# Patient Record
Sex: Female | Born: 1970 | Race: White | Hispanic: No | Marital: Married | Smoking: Never smoker
Health system: Southern US, Community
[De-identification: ages and names within clinical notes are randomized; demographics above are authoritative.]

## PROBLEM LIST (undated history)

## (undated) DIAGNOSIS — F32A Depression, unspecified: Secondary | ICD-10-CM

## (undated) DIAGNOSIS — T7840XA Allergy, unspecified, initial encounter: Secondary | ICD-10-CM

## (undated) DIAGNOSIS — Z8619 Personal history of other infectious and parasitic diseases: Secondary | ICD-10-CM

## (undated) DIAGNOSIS — F329 Major depressive disorder, single episode, unspecified: Secondary | ICD-10-CM

## (undated) HISTORY — DX: Allergy, unspecified, initial encounter: T78.40XA

## (undated) HISTORY — DX: Major depressive disorder, single episode, unspecified: F32.9

## (undated) HISTORY — DX: Personal history of other infectious and parasitic diseases: Z86.19

## (undated) HISTORY — DX: Depression, unspecified: F32.A

---

## 1998-09-28 ENCOUNTER — Other Ambulatory Visit: Admission: RE | Admit: 1998-09-28 | Discharge: 1998-09-28 | Payer: Self-pay | Admitting: Obstetrics and Gynecology

## 1999-05-08 ENCOUNTER — Other Ambulatory Visit: Admission: RE | Admit: 1999-05-08 | Discharge: 1999-05-08 | Payer: Self-pay | Admitting: Obstetrics and Gynecology

## 1999-05-23 ENCOUNTER — Inpatient Hospital Stay (HOSPITAL_COMMUNITY): Admission: AD | Admit: 1999-05-23 | Discharge: 1999-05-23 | Payer: Self-pay | Admitting: Obstetrics & Gynecology

## 1999-09-14 ENCOUNTER — Inpatient Hospital Stay (HOSPITAL_COMMUNITY): Admission: AD | Admit: 1999-09-14 | Discharge: 1999-09-14 | Payer: Self-pay | Admitting: Obstetrics and Gynecology

## 1999-09-25 ENCOUNTER — Inpatient Hospital Stay (HOSPITAL_COMMUNITY): Admission: AD | Admit: 1999-09-25 | Discharge: 1999-09-25 | Payer: Self-pay | Admitting: Obstetrics and Gynecology

## 1999-10-19 ENCOUNTER — Inpatient Hospital Stay (HOSPITAL_COMMUNITY): Admission: AD | Admit: 1999-10-19 | Discharge: 1999-10-19 | Payer: Self-pay | Admitting: Obstetrics & Gynecology

## 1999-10-19 ENCOUNTER — Encounter: Payer: Self-pay | Admitting: Obstetrics & Gynecology

## 1999-12-01 ENCOUNTER — Inpatient Hospital Stay (HOSPITAL_COMMUNITY): Admission: AD | Admit: 1999-12-01 | Discharge: 1999-12-06 | Payer: Self-pay | Admitting: Obstetrics and Gynecology

## 1999-12-01 ENCOUNTER — Encounter (INDEPENDENT_AMBULATORY_CARE_PROVIDER_SITE_OTHER): Payer: Self-pay | Admitting: Specialist

## 2000-02-05 ENCOUNTER — Other Ambulatory Visit: Admission: RE | Admit: 2000-02-05 | Discharge: 2000-02-05 | Payer: Self-pay | Admitting: Obstetrics and Gynecology

## 2001-05-26 ENCOUNTER — Other Ambulatory Visit: Admission: RE | Admit: 2001-05-26 | Discharge: 2001-05-26 | Payer: Self-pay | Admitting: Obstetrics and Gynecology

## 2005-03-14 ENCOUNTER — Ambulatory Visit: Payer: Self-pay | Admitting: Internal Medicine

## 2005-04-04 ENCOUNTER — Ambulatory Visit: Payer: Self-pay | Admitting: Internal Medicine

## 2005-04-10 ENCOUNTER — Ambulatory Visit: Payer: Self-pay | Admitting: Internal Medicine

## 2005-08-18 ENCOUNTER — Ambulatory Visit: Payer: Self-pay | Admitting: Internal Medicine

## 2005-10-27 ENCOUNTER — Ambulatory Visit: Payer: Self-pay | Admitting: Internal Medicine

## 2007-03-04 ENCOUNTER — Ambulatory Visit: Payer: Self-pay | Admitting: Internal Medicine

## 2007-03-04 LAB — CONVERTED CEMR LAB
ALT: 12 units/L (ref 0–40)
AST: 24 units/L (ref 0–37)
Albumin: 3.6 g/dL (ref 3.5–5.2)
Alkaline Phosphatase: 58 units/L (ref 39–117)
BUN: 12 mg/dL (ref 6–23)
Basophils Absolute: 0 10*3/uL (ref 0.0–0.1)
Basophils Relative: 0.4 % (ref 0.0–1.0)
Bilirubin, Direct: 0.1 mg/dL (ref 0.0–0.3)
CO2: 28 meq/L (ref 19–32)
Calcium: 9.5 mg/dL (ref 8.4–10.5)
Chloride: 104 meq/L (ref 96–112)
Cholesterol: 196 mg/dL (ref 0–200)
Creatinine, Ser: 1 mg/dL (ref 0.4–1.2)
Eosinophils Absolute: 0.2 10*3/uL (ref 0.0–0.6)
Eosinophils Relative: 2.4 % (ref 0.0–5.0)
GFR calc Af Amer: 82 mL/min
GFR calc non Af Amer: 67 mL/min
Glucose, Bld: 86 mg/dL (ref 70–99)
HCT: 41.4 % (ref 36.0–46.0)
HDL: 51.6 mg/dL (ref >39.0)
Hemoglobin: 14.6 g/dL (ref 12.0–15.0)
LDL Cholesterol: 112 mg/dL — ABNORMAL HIGH (ref 0–99)
Lymphocytes Relative: 37.6 % (ref 12.0–46.0)
MCHC: 35.2 g/dL (ref 30.0–36.0)
MCV: 90.3 fL (ref 78.0–100.0)
Monocytes Absolute: 0.8 10*3/uL — ABNORMAL HIGH (ref 0.2–0.7)
Monocytes Relative: 11.4 % — ABNORMAL HIGH (ref 3.0–11.0)
Neutro Abs: 3.4 10*3/uL (ref 1.4–7.7)
Neutrophils Relative %: 48.2 % (ref 43.0–77.0)
Platelets: 311 10*3/uL (ref 150–400)
Potassium: 4.1 meq/L (ref 3.5–5.1)
RBC: 4.59 M/uL (ref 3.87–5.11)
RDW: 12.3 % (ref 11.5–14.6)
Sodium: 139 meq/L (ref 135–145)
TSH: 0.48 microintl units/mL (ref 0.35–5.50)
Total Bilirubin: 0.9 mg/dL (ref 0.3–1.2)
Total CHOL/HDL Ratio: 3.8
Total Protein: 6.9 g/dL (ref 6.0–8.3)
Triglycerides: 164 mg/dL — ABNORMAL HIGH (ref 0–149)
VLDL: 33 mg/dL (ref 0–40)
WBC: 7.1 10*3/uL (ref 4.5–10.5)

## 2007-03-11 ENCOUNTER — Ambulatory Visit: Payer: Self-pay | Admitting: Internal Medicine

## 2007-03-15 ENCOUNTER — Ambulatory Visit: Payer: Self-pay | Admitting: Internal Medicine

## 2007-04-29 ENCOUNTER — Ambulatory Visit: Payer: Self-pay | Admitting: Internal Medicine

## 2007-04-29 DIAGNOSIS — J020 Streptococcal pharyngitis: Secondary | ICD-10-CM

## 2007-04-29 DIAGNOSIS — J309 Allergic rhinitis, unspecified: Secondary | ICD-10-CM | POA: Insufficient documentation

## 2007-04-29 DIAGNOSIS — F988 Other specified behavioral and emotional disorders with onset usually occurring in childhood and adolescence: Secondary | ICD-10-CM

## 2007-04-29 DIAGNOSIS — F32A Depression, unspecified: Secondary | ICD-10-CM | POA: Insufficient documentation

## 2007-04-29 DIAGNOSIS — F329 Major depressive disorder, single episode, unspecified: Secondary | ICD-10-CM

## 2007-04-29 LAB — CONVERTED CEMR LAB
Bilirubin Urine: NEGATIVE
Nitrite: NEGATIVE
Rapid Strep: NEGATIVE
Specific Gravity, Urine: >=1.03
Urobilinogen, UA: 0.2
WBC Urine, dipstick: NEGATIVE
pH: 5.5

## 2007-07-30 ENCOUNTER — Telehealth: Payer: Self-pay | Admitting: Internal Medicine

## 2007-08-16 ENCOUNTER — Telehealth: Payer: Self-pay | Admitting: Internal Medicine

## 2007-08-24 ENCOUNTER — Telehealth: Payer: Self-pay | Admitting: Internal Medicine

## 2007-10-08 ENCOUNTER — Telehealth: Payer: Self-pay | Admitting: Internal Medicine

## 2008-01-17 ENCOUNTER — Telehealth: Payer: Self-pay | Admitting: Internal Medicine

## 2008-04-18 ENCOUNTER — Telehealth: Payer: Self-pay | Admitting: *Deleted

## 2008-06-12 ENCOUNTER — Telehealth: Payer: Self-pay | Admitting: Internal Medicine

## 2008-06-19 ENCOUNTER — Ambulatory Visit: Payer: Self-pay | Admitting: Internal Medicine

## 2008-06-19 DIAGNOSIS — J209 Acute bronchitis, unspecified: Secondary | ICD-10-CM | POA: Insufficient documentation

## 2008-06-19 DIAGNOSIS — R062 Wheezing: Secondary | ICD-10-CM

## 2008-06-20 ENCOUNTER — Telehealth: Payer: Self-pay | Admitting: Internal Medicine

## 2008-07-07 ENCOUNTER — Telehealth: Payer: Self-pay | Admitting: Internal Medicine

## 2008-07-10 ENCOUNTER — Telehealth: Payer: Self-pay | Admitting: Internal Medicine

## 2008-07-11 ENCOUNTER — Telehealth: Payer: Self-pay | Admitting: Internal Medicine

## 2008-07-11 DIAGNOSIS — R059 Cough, unspecified: Secondary | ICD-10-CM | POA: Insufficient documentation

## 2008-07-11 DIAGNOSIS — R05 Cough: Secondary | ICD-10-CM

## 2008-07-12 ENCOUNTER — Ambulatory Visit: Payer: Self-pay | Admitting: Internal Medicine

## 2008-08-01 ENCOUNTER — Telehealth: Payer: Self-pay | Admitting: Internal Medicine

## 2008-08-07 ENCOUNTER — Telehealth: Payer: Self-pay | Admitting: Internal Medicine

## 2008-08-08 ENCOUNTER — Ambulatory Visit: Payer: Self-pay | Admitting: Internal Medicine

## 2008-09-19 ENCOUNTER — Ambulatory Visit: Payer: Self-pay | Admitting: Family Medicine

## 2008-09-19 DIAGNOSIS — M542 Cervicalgia: Secondary | ICD-10-CM | POA: Insufficient documentation

## 2008-09-19 DIAGNOSIS — G44209 Tension-type headache, unspecified, not intractable: Secondary | ICD-10-CM

## 2008-10-02 ENCOUNTER — Ambulatory Visit: Payer: Self-pay | Admitting: Internal Medicine

## 2008-10-02 ENCOUNTER — Telehealth: Payer: Self-pay | Admitting: Internal Medicine

## 2008-10-04 ENCOUNTER — Telehealth: Payer: Self-pay | Admitting: Internal Medicine

## 2008-11-09 ENCOUNTER — Telehealth: Payer: Self-pay | Admitting: Internal Medicine

## 2008-11-15 ENCOUNTER — Telehealth: Payer: Self-pay | Admitting: Internal Medicine

## 2008-12-13 ENCOUNTER — Telehealth: Payer: Self-pay | Admitting: Internal Medicine

## 2008-12-27 ENCOUNTER — Telehealth: Payer: Self-pay | Admitting: Internal Medicine

## 2009-02-02 ENCOUNTER — Telehealth: Payer: Self-pay | Admitting: Internal Medicine

## 2009-03-13 ENCOUNTER — Telehealth: Payer: Self-pay | Admitting: Internal Medicine

## 2009-06-04 ENCOUNTER — Telehealth: Payer: Self-pay | Admitting: Internal Medicine

## 2009-06-18 ENCOUNTER — Telehealth: Payer: Self-pay | Admitting: Internal Medicine

## 2009-08-06 ENCOUNTER — Telehealth: Payer: Self-pay | Admitting: Internal Medicine

## 2009-09-19 ENCOUNTER — Telehealth: Payer: Self-pay | Admitting: Internal Medicine

## 2009-09-20 ENCOUNTER — Ambulatory Visit: Payer: Self-pay | Admitting: Internal Medicine

## 2009-09-20 DIAGNOSIS — B009 Herpesviral infection, unspecified: Secondary | ICD-10-CM | POA: Insufficient documentation

## 2009-09-20 DIAGNOSIS — G4700 Insomnia, unspecified: Secondary | ICD-10-CM

## 2009-10-22 ENCOUNTER — Telehealth: Payer: Self-pay | Admitting: Internal Medicine

## 2009-10-24 ENCOUNTER — Telehealth: Payer: Self-pay | Admitting: Internal Medicine

## 2010-01-03 ENCOUNTER — Telehealth: Payer: Self-pay | Admitting: Internal Medicine

## 2010-01-08 ENCOUNTER — Telehealth: Payer: Self-pay | Admitting: Internal Medicine

## 2010-02-05 ENCOUNTER — Ambulatory Visit: Payer: Self-pay | Admitting: Family Medicine

## 2010-02-05 DIAGNOSIS — J019 Acute sinusitis, unspecified: Secondary | ICD-10-CM

## 2010-02-21 ENCOUNTER — Ambulatory Visit: Payer: Self-pay | Admitting: Internal Medicine

## 2010-04-10 ENCOUNTER — Telehealth: Payer: Self-pay | Admitting: Internal Medicine

## 2010-05-06 ENCOUNTER — Ambulatory Visit: Payer: Self-pay | Admitting: Family Medicine

## 2010-05-06 DIAGNOSIS — E669 Obesity, unspecified: Secondary | ICD-10-CM

## 2010-05-07 ENCOUNTER — Encounter: Payer: Self-pay | Admitting: Internal Medicine

## 2010-05-13 ENCOUNTER — Telehealth: Payer: Self-pay | Admitting: Family Medicine

## 2010-05-16 ENCOUNTER — Telehealth: Payer: Self-pay | Admitting: Family Medicine

## 2010-07-15 ENCOUNTER — Telehealth: Payer: Self-pay | Admitting: Internal Medicine

## 2010-07-17 ENCOUNTER — Telehealth: Payer: Self-pay | Admitting: Internal Medicine

## 2010-07-22 ENCOUNTER — Telehealth: Payer: Self-pay | Admitting: Internal Medicine

## 2010-07-24 ENCOUNTER — Ambulatory Visit: Payer: Self-pay | Admitting: Internal Medicine

## 2010-07-24 DIAGNOSIS — D485 Neoplasm of uncertain behavior of skin: Secondary | ICD-10-CM

## 2010-07-25 ENCOUNTER — Telehealth: Payer: Self-pay | Admitting: Internal Medicine

## 2010-08-29 ENCOUNTER — Telehealth: Payer: Self-pay | Admitting: Internal Medicine

## 2010-10-22 NOTE — Progress Notes (Signed)
Summary: new rx  Phone Note Refill Request Call back at Home Phone [phone] Message from:  Patient  Refills Requested: Medication #1:  ADDERALL 10 MG  TABS one in pm adderall 20 xr #90 for both rxs  Initial call taken by: Heron Sabins,  August 29, 2010 12:14 PM    New/Updated Medications: ADDERALL XR 20 MG XR24H-CAP (AMPHETAMINE-DEXTROAMPHETAMINE) 1 once daily Prescriptions: ADDERALL XR 20 MG XR24H-CAP (AMPHETAMINE-DEXTROAMPHETAMINE) 1 once daily  #90 x 0   Entered by:   Willy Eddy, LPN   Authorized by:   Stacie Glaze MD   Signed by:   Willy Eddy, LPN on 09/81/1914   Method used:   Print then Give to Patient   RxID:   [id number]   Appended Document: new rx talked with pt and she stopped vyvanse and dr Lovell Sheehan said ok to 20 mg

## 2010-10-22 NOTE — Progress Notes (Signed)
Summary: regular ambien  Phone Note Call from Patient Call back at [place] Phone [phone]   Summary of Call: Requests the regular ambien again, the CR makes her too groggy the next day even taking 1/2.  [facility] Initial call taken by: Rudy Jew, RN,  October 24, 2009 12:38 PM    New/Updated Medications: ZOLPIDEM TARTRATE 10 MG TABS (ZOLPIDEM TARTRATE) 1 at bedtime as needed Prescriptions: ZOLPIDEM TARTRATE 10 MG TABS (ZOLPIDEM TARTRATE) 1 at bedtime as needed  #30 x 1   Entered by:   Willy Eddy, LPN   Authorized by:   Stacie Glaze MD   Signed by:   Willy Eddy, LPN on 46/96/2952   Method used:   Telephoned to ...       [facility] [phone]* (retail)       [address]       [place], [place]  [address]       Ph: [phone]       Fax: [phone]   RxID:   [id number]

## 2010-10-22 NOTE — Assessment & Plan Note (Signed)
Summary: ?SINUS INF/COUGH/CONGESTION/CJR   Vital Signs:  Patient profile:   40 year old female Temp:     98.3 degrees F oral BP sitting:   120 / 84  (left arm) Cuff size:   large  Vitals Entered By: Sid Falcon LPN (Feb 05, 2010 9:42 AM) CC: Questioning sinus infection, cough, congestion   History of Present Illness: Patient seen as a work in with one-month history of malaise and sinus pressure and intermittent headaches. Occasional yellow-green nasal discharge. She has history of some seasonal allergies but has been taking Allegra-D without improvement. Does have some intermittent facial discomfort. No fever. Request refills Nasacort AQ.  Allergies (verified): No Known Drug Allergies  Past History:  Past Medical History: Last updated: 04/29/2007 Allergic rhinitis Depression mono as an adult  Review of Systems      See HPI  Physical Exam  General:  Well-developed,well-nourished,in no acute distress; alert,appropriate and cooperative throughout examination Head:  Normocephalic and atraumatic without obvious abnormalities. No apparent alopecia or balding. Ears:  External ear exam shows no significant lesions or deformities.  Otoscopic examination reveals clear canals, tympanic membranes are intact bilaterally without bulging, retraction, inflammation or discharge. Hearing is grossly normal bilaterally. Nose:  External nasal examination shows no deformity or inflammation. Nasal mucosa are pink and moist without lesions or exudates. Mouth:  Oral mucosa and oropharynx without lesions or exudates.  Teeth in good repair. Neck:  No deformities, masses, or tenderness noted. Lungs:  Normal respiratory effort, chest expands symmetrically. Lungs are clear to auscultation, no crackles or wheezes. Heart:  Normal rate and regular rhythm. S1 and S2 normal without gallop, murmur, click, rub or other extra sounds.   Impression & Recommendations:  Problem # 1:  SINUSITIS, ACUTE  (ICD-461.9) start clarithromycin and refil Nasacort AQ. Her updated medication list for this problem includes:    Fexofenadine-pseudoephedrine 60-120 Mg Xr12h-tab (Fexofenadine-pseudoephedrine) ..... One by mouth bid    Nasacort Aq 55 Mcg/act Aers (Triamcinolone acetonide(nasal)) .Marland Kitchen..Marland Kitchen Two sprays q nostril q day    Bromfed 12-15 Mg Xr12h-cap (Brompheniramine-phenylephrine) .Marland Kitchen... 1 two times a day for 10 days    Clarithromycin 500 Mg Xr24h-tab (Clarithromycin) ..... One by mouth two times a day for 10 days  Complete Medication List: 1)  Fexofenadine-pseudoephedrine 60-120 Mg Xr12h-tab (Fexofenadine-pseudoephedrine) .... One by mouth bid 2)  Pristiq 100 Mg Xr24h-tab (Desvenlafaxine succinate) .... One by mouth daily 3)  Adderall Xr 20 Mg Cp24 (Amphetamine-dextroamphetamine) .... Once daily 4)  Adderall 10 Mg Tabs (Amphetamine-dextroamphetamine) .... One in pm 5)  Nasacort Aq 55 Mcg/act Aers (Triamcinolone acetonide(nasal)) .... Two sprays q nostril q day 6)  Bromfed 12-15 Mg Xr12h-cap (Brompheniramine-phenylephrine) .Marland Kitchen.. 1 two times a day for 10 days 7)  Diclofenac Sodium 50 Mg Tbec (Diclofenac sodium) .... Three times a day as needed pain 8)  Flexeril 10 Mg Tabs (Cyclobenzaprine hcl) .... Three times a day as needed spasm 9)  Zolpidem Tartrate 10 Mg Tabs (Zolpidem tartrate) .Marland Kitchen.. 1 at bedtime as needed 10)  Valacyclovir Hcl 500 Mg Tabs (Valacyclovir hcl) .... One by mouth daily 11)  Clarithromycin 500 Mg Xr24h-tab (Clarithromycin) .... One by mouth two times a day for 10 days  Patient Instructions: 1)  Acute sinusitis symptoms for less than 10 days are not helped by antibiotics. Use warm moist compresses, and over the counter decongestants( only as directed). Call if no improvement in 5-7 days, sooner if increasing pain, fever, or new symptoms.  Prescriptions: CLARITHROMYCIN 500 MG XR24H-TAB (CLARITHROMYCIN) one by mouth two  times a day for 10 days  #20 x 0   Entered and Authorized by:   Evelena Peat MD   Signed by:   Evelena Peat MD on 02/05/2010   Method used:   Electronically to        [facility] [phone]* (retail)       [address]       [place], [place]  [address]       Ph: [phone]       Fax: [phone]   RxID:   [id number] NASACORT AQ 55 MCG/ACT AERS (TRIAMCINOLONE ACETONIDE(NASAL)) two sprays q nostril q day  #3 x 3   Entered and Authorized by:   Evelena Peat MD   Signed by:   Evelena Peat MD on 02/05/2010   Method used:   Electronically to        [facility] [phone]* (retail)       [address]       [place], [place]  [address]       Ph: [phone]       Fax: [phone]   RxID:   [id number]

## 2010-10-22 NOTE — Progress Notes (Signed)
Summary: note pt refused ct for chronic sinus problems  ---- Converted from flag ---- ---- 10/24/2009 12:39 PM, Camelia Eng Slaugenhaupt wrote: FYI:    Pt called and said she does not want to have this CT Sinus done. ------------------------------

## 2010-10-22 NOTE — Assessment & Plan Note (Signed)
Summary: ?bladder inf/njr   Vital Signs:  Patient profile:   40 year old female LMP:     04/22/2010 Weight:      216 pounds O2 Sat:      98 % on Room air Temp:     98.4 degrees F oral Pulse rate:   107 / minute BP sitting:   120 / 80  (left arm) Cuff size:   regular  Vitals Entered By: Romualdo Bolk, CMA (AAMA) (May 06, 2010 2:29 PM)  O2 Flow:  Room air CC: Burning up on urination- started on 8/12 LMP (date): 04/22/2010     Enter LMP: 04/22/2010   History of Present Illness: Patient in today with c/o dysuria/frequency/urgency/malaise/myalgias and lower abdominal pressure. She denies any f/c/hematuria/history of kidney stones. Her symptoms started mildly about 3 days a ago with some freguency she thought was related to the end of her menses. Unfortunately symptoms worsened over the weekend to the list above. She purchased Cystex plus with Sodium Salicylate and Methamine and did get some temporary relief but she says she felt funny on it so she stopped it. She has recently cut down on carbohydrates in her diet but believes she is maintaining adequate hydration.  Preventive Screening-Counseling & Management  Alcohol-Tobacco     Smoking Status: never  Current Medications (verified): 1)  Fexofenadine-Pseudoephedrine 60-120 Mg Xr12h-Tab (Fexofenadine-Pseudoephedrine) .Marland Kitchen.. 1 Once Daily 2)  Pristiq 50 Mg Xr24h-Tab (Desvenlafaxine Succinate) .Marland Kitchen.. 1 Once Daily 3)  Adderall Xr 20 Mg  Cp24 (Amphetamine-Dextroamphetamine) .... Once Daily 4)  Adderall 10 Mg  Tabs (Amphetamine-Dextroamphetamine) .... One in Pm 5)  Flexeril 10 Mg Tabs (Cyclobenzaprine Hcl) .... Three Times A Day As Needed Spasm 6)  Zolpidem Tartrate 10 Mg Tabs (Zolpidem Tartrate) .Marland Kitchen.. 1 At Bedtime As Needed 7)  Valacyclovir Hcl 500 Mg Tabs (Valacyclovir Hcl) .... One By Mouth Daily As Needed 8)  Nasonex 50 Mcg/act Susp (Mometasone Furoate) .... Two Spray in Nostril Daily  Allergies (verified): No Known Drug  Allergies  Past History:  Past medical history reviewed for relevance to current acute and chronic problems. Social history (including risk factors) reviewed for relevance to current acute and chronic problems.  Past Medical History: Reviewed history from 02/21/2010 and no changes required. Allergic rhinitis Depression mono as an adult chronic sinusitis  Social History: Reviewed history and no changes required.  Review of Systems      See HPI  Physical Exam  General:  Well-developed,well-nourished,in no acute distress; alert,appropriate and cooperative throughout examination Mouth:  Oral mucosa and oropharynx without lesions or exudates.  Teeth in good repair. Neck:  No deformities, masses, or tenderness noted. Lungs:  Normal respiratory effort, chest expands symmetrically. Lungs are clear to auscultation, no crackles or wheezes. Heart:  Normal rate and regular rhythm. S1 and S2 normal without gallop, murmur, click, rub or other extra sounds. Abdomen:  Bowel sounds positive,abdomen soft and non-tender without masses, organomegaly or hernias noted. Extremities:  No clubbing, cyanosis, edema, or deformity noted  Psych:  Cognition and judgment appear intact. Alert and cooperative with normal attention span and concentration. No apparent delusions, illusions, hallucinations   Impression & Recommendations:  Problem # 1:  DYSURIA (ICD-788.1)  The following medications were removed from the medication list:    Amoxicillin 500 Mg Tabs (Amoxicillin) ..... One by mouth three times a day for 10 days Her updated medication list for this problem includes:    Pyridium 200 Mg Tabs (Phenazopyridine hcl) .Marland Kitchen... 1 tab by mouth three times  a day 2 days    Bactrim Ds 800-160 Mg Tabs (Sulfamethoxazole-trimethoprim) .Marland Kitchen... 1 tab by mouth two times a day x 5 days  Orders: T-Urine Culture (Spectrum Order) [id number]) Maintain adequate hydration and is given Align samples to take daily while on  antibiotics and as needed after that.   Problem # 2:  OVERWEIGHT (ICD-278.02) Patient down from 225 at last visit and a hi of 256. Is encouraged to continue minimizing her carbohydrate intake and maintain adequate activity level  Complete Medication List: 1)  Fexofenadine-pseudoephedrine 60-120 Mg Xr12h-tab (Fexofenadine-pseudoephedrine) .Marland Kitchen.. 1 once daily 2)  Pristiq 50 Mg Xr24h-tab (Desvenlafaxine succinate) .Marland Kitchen.. 1 once daily 3)  Adderall Xr 20 Mg Cp24 (Amphetamine-dextroamphetamine) .... Once daily 4)  Adderall 10 Mg Tabs (Amphetamine-dextroamphetamine) .... One in pm 5)  Flexeril 10 Mg Tabs (Cyclobenzaprine hcl) .... Three times a day as needed spasm 6)  Zolpidem Tartrate 10 Mg Tabs (Zolpidem tartrate) .Marland Kitchen.. 1 at bedtime as needed 7)  Valacyclovir Hcl 500 Mg Tabs (Valacyclovir hcl) .... One by mouth daily as needed 8)  Nasonex 50 Mcg/act Susp (Mometasone furoate) .... Two spray in nostril daily 9)  Pyridium 200 Mg Tabs (Phenazopyridine hcl) .Marland Kitchen.. 1 tab by mouth three times a day 2 days 10)  Bactrim Ds 800-160 Mg Tabs (Sulfamethoxazole-trimethoprim) .Marland Kitchen.. 1 tab by mouth two times a day x 5 days  Patient Instructions: 1)  Drink plenty of fluids up to 3-4 quarts a day. Cranberry juice is especially recommended in addition to large amounts of water. Avoid caffeine & carbonated drinks, they tend to irritate the bladder, Return in 3-5 days if you're not better: sooner if you're feeling worse.  2)  Take your antibiotic as prescribed until ALL of it is gone, but stop if you develop a rash or swelling and contact our office as soon as possible.  3)  Please schedule a follow-up appointment as needed if symptoms worsen or do not improve. Prescriptions: BACTRIM DS 800-160 MG TABS (SULFAMETHOXAZOLE-TRIMETHOPRIM) 1 tab by mouth two times a day x 5 days  #10 x 0   Entered and Authorized by:   Danise Edge MD   Signed by:   Danise Edge MD on 05/06/2010   Method used:   Electronically to        [facility] [phone]* (retail)       [address]       [place], [place]  [address]       Ph: [phone]       Fax: [phone]   RxID:   [id number] PYRIDIUM 200 MG TABS (PHENAZOPYRIDINE HCL) 1 tab by mouth three times a day 2 days  #6 x 0   Entered and Authorized by:   Danise Edge MD   Signed by:   Danise Edge MD on 05/06/2010   Method used:   Electronically to        [facility] [phone]* (retail)       [address]       [place], [place]  [address]       Ph: [phone]       Fax: [phone]   RxID:   [id number]   Appended Document: ?bladder inf/njr    Clinical Lists Changes  Orders: Added new Service order of UA Dipstick w/o Micro (manual) (35009) - Signed Observations: Added new observation of COMMENTS: Romualdo Bolk, CMA (AAMA)  May 06, 2010 3:23 PM  (05/06/2010 15:22) Added new observation of PH URINE: 6.0  (05/06/2010 15:22) Added new observation of SPEC GR URIN: 1.010  (05/06/2010 15:22) Added new observation of APPEARANCE U: Clear  (05/06/2010 15:22) Added new observation of UA COLOR: yellow  (05/06/2010 15:22) Added new observation of WBC DIPSTK U: large  (05/06/2010 15:22) Added new observation of NITRITE URN: negative  (05/06/2010 15:22) Added new observation of UROBILINOGEN: 0.2  (05/06/2010 15:22) Added new observation of PROTEIN, URN: 30  (05/06/2010 15:22) Added new observation of BLOOD UR DIP: large  (05/06/2010 15:22) Added new observation of KETONES URN: trace (5)  (05/06/2010 15:22) Added new observation of BILIRUBIN UR: negative  (05/06/2010 15:22) Added new observation of GLUCOSE, URN: negative  (05/06/2010 15:22)      Laboratory Results   Urine Tests  Date/Time Received: May 06, 2010 3:23 PM   Routine Urinalysis   Color: yellow Appearance: Clear Glucose: negative   (Normal Range: Negative) Bilirubin: negative   (Normal Range: Negative) Ketone: trace (5)    (Normal Range: Negative) Spec. Gravity: 1.010   (Normal Range: 1.003-1.035) Blood: large   (Normal Range: Negative) pH: 6.0   (Normal Range: 5.0-8.0) Protein: 30   (Normal Range: Negative) Urobilinogen: 0.2   (Normal Range: 0-1) Nitrite: negative   (Normal Range: Negative) Leukocyte Esterace: large   (Normal Range: Negative)    Comments: Romualdo Bolk, CMA (AAMA)  May 06, 2010 3:23 PM

## 2010-10-22 NOTE — Progress Notes (Signed)
Summary: reaction to Septra  Phone Note Call from Patient Call back at Home Phone [phone]   Caller: Patient Call For: Stacie Glaze MD Summary of Call: Pt is having a rash and itching from the Bactrim, and would like a different antibiotic.  Dr Abner Greenspan is treating her for an antibiotic.  Initial call taken by: Lynann Beaver CMA,  May 16, 2010 3:36 PM  Follow-up for Phone Call        Stop Septra and start Keflex 500 mg by mouth three times a day for 5 days.  She needs office f/u and repeat UA if no better after that. Follow-up by: Evelena Peat MD,  May 16, 2010 5:38 PM  Additional Follow-up for Phone Call Additional follow up Details #1::        Pt informed, added septra to allergy list Additional Follow-up by: Sid Falcon LPN,  May 16, 2010 5:45 PM   New Allergies: SEPTRA (SULFAMETHOXAZOLE-TRIMETHOPRIM) New/Updated Medications: KEFLEX 500 MG CAPS (CEPHALEXIN) one tab three times a day New Allergies: SEPTRA (SULFAMETHOXAZOLE-TRIMETHOPRIM)Prescriptions: KEFLEX 500 MG CAPS (CEPHALEXIN) one tab three times a day  #15 x 0   Entered by:   Sid Falcon LPN   Authorized by:   Evelena Peat MD   Signed by:   Sid Falcon LPN on 10/62/6948   Method used:   Electronically to        [facility] [phone]* (retail)       [address]       [place], [place]  [address]       Ph: [phone]       Fax: [phone]   RxID:   [id number]

## 2010-10-22 NOTE — Progress Notes (Signed)
Summary: continued symptoms of UTI  Phone Note Call from Patient   Caller: Patient Call For: Stacie Glaze MD Summary of Call: Still having UTI symptoms. ? more antibiotics? [facility]) [phone] Initial call taken by: Lynann Beaver CMA,  May 13, 2010 2:27 PM  Follow-up for Phone Call        if Bactrim DS was helpful while on it can extend it out 7 more days, if no improvement would change to Ciprofloxacin 500mg  by mouth two times a day x 7 days Follow-up by: Danise Edge MD,  May 13, 2010 2:30 PM  Additional Follow-up for Phone Call Additional follow up Details #1::        Left message for pt to return my call. Additional Follow-up by: Josph Macho RMA,  May 13, 2010 2:55 PM    Additional Follow-up for Phone Call Additional follow up Details #2::    Pt states she felt like the Bactrim helped just needed more of it. Sent RX to pharmacy Follow-up by: Josph Macho RMA,  May 13, 2010 4:11 PM  New/Updated Medications: BACTRIM DS 800-160 MG TABS (SULFAMETHOXAZOLE-TRIMETHOPRIM) 1 tab by mouth two times a day x 7 days Prescriptions: BACTRIM DS 800-160 MG TABS (SULFAMETHOXAZOLE-TRIMETHOPRIM) 1 tab by mouth two times a day x 7 days  #14 x 0   Entered by:   Josph Macho RMA   Authorized by:   Danise Edge MD   Signed by:   Josph Macho RMA on 05/13/2010   Method used:   Electronically to        [facility] [phone]* (retail)       [address]       [place], [place]  [address]       Ph: [phone]       Fax: [phone]   RxID:   [id number]

## 2010-10-22 NOTE — Progress Notes (Signed)
Summary: cheaper med  Phone Note Call from Patient Call back at Home Phone [phone]   Caller: vm Reason for Call: Acute Illness Summary of Call: Allegra D OTC for my husband & me way too expensive.  Is there something else, OTC or RX, that would be cheaper? Called to let her know it will be next week - left message.  Suggested any OTC generic antihistamine with D.  Rudy Jew, RN  July 15, 2010 2:58 PM  Initial call taken by: Rudy Jew, RN,  July 15, 2010 2:53 PM  Follow-up for Phone Call        ,lmom and suggested advil cold and sinus Follow-up by: Willy Eddy, LPN,  July 15, 2010 3:55 PM

## 2010-10-22 NOTE — Progress Notes (Signed)
Summary: grieving father's death  Phone Note Call from Patient Call back at Work Phone    Caller: Patient Call For: Stacie Glaze MD Reason for Call: Talk to Nurse Summary of Call: Pt is requesting refill on Xanax called to [facility]). [phone] Initial call taken by: Lynann Beaver CMA,  January 08, 2010 12:37 PM  Follow-up for Phone Call        father died August 28, 2008-----1 and 1/2 years ago--cant find where she has ever been on xanax Follow-up by: Willy Eddy, LPN,  January 08, 2010 12:47 PM  Additional Follow-up for Phone Call Additional follow up Details #1::        per dr Lovell Sheehan- needs office visit to discuss this Additional Follow-up by: Willy Eddy, LPN,  January 08, 2010 2:42 PM    Additional Follow-up for Phone Call Additional follow up Details #2::    Pt. notified, and appt scheduled with Dr. Lovell Sheehan. Follow-up by: Lynann Beaver CMA,  January 08, 2010 3:02 PM

## 2010-10-22 NOTE — Progress Notes (Signed)
Summary: NEW RXS  Phone Note Call from Patient Call back at Home Phone [phone]   Caller: Patient Call For: Stacie Glaze MD Summary of Call: PT NEEDS 3 MONTH RX OF ADDERALL XR 20 MG AND ADDERALL 10 MG Initial call taken by: Heron Sabins,  April 10, 2010 3:12 PM    Prescriptions: ADDERALL 10 MG  TABS (AMPHETAMINE-DEXTROAMPHETAMINE) one in pm  #90 x 0   Entered by:   Willy Eddy, LPN   Authorized by:   Stacie Glaze MD   Signed by:   Willy Eddy, LPN on 44/11/4740   Method used:   Print then Give to Patient   RxID:   [id number] ADDERALL XR 20 MG  CP24 (AMPHETAMINE-DEXTROAMPHETAMINE) once daily  #90 x 0   Entered by:   Willy Eddy, LPN   Authorized by:   Stacie Glaze MD   Signed by:   Willy Eddy, LPN on 88/41/6606   Method used:   Print then Give to Patient   RxID:   [id number]

## 2010-10-22 NOTE — Progress Notes (Signed)
Summary: adderall  Phone Note Call from Patient Call back at Home Phone [phone]   Caller: vm Reason for Call: Acute Illness Summary of Call: ? about Adderall on back order 4 months.  Cannot do without 30 or 60 days.  [facility].  Samples.  Any help.   Initial call taken by: Rudy Jew, RN,  July 22, 2010 11:57 AM  Follow-up for Phone Call        left message on machine has ov on wednesday and will discuss adderall at thatm time Follow-up by: Willy Eddy, LPN,  July 22, 2010 2:42 PM

## 2010-10-22 NOTE — Assessment & Plan Note (Signed)
Summary: mole removal/njr PT RSC/NJR/pt rescd//ccm   Vital Signs:  Patient profile:   40 year old female Height:      70 inches Temp:     98.2 degrees F oral Pulse rate:   80 / minute Resp:     14 per minute BP sitting:   120 / 80  (left arm)  Vitals Entered By: Willy Eddy, LPN (July 24, 2010 3:14 PM) CC: mole removal on abd and discuss alternative for adderall Is Patient Diabetic? No   Primary Care Kandice Schmelter:  Stacie Glaze MD  CC:  mole removal on abd and discuss alternative for adderall.  History of Present Illness: the pharmacy cannot find a supply fo the adderal and she has noted increased distraction and other symptoms of ADHD at work  she has a mole on her abd that was identified in the cpx as being at risk for neoplasm    Preventive Screening-Counseling & Management  Alcohol-Tobacco     Smoking Status: never     Tobacco Counseling: not indicated; no tobacco use  Problems Prior to Update: 1)  Neoplasm, Skin, Uncertain Behavior  (ICD-238.2) 2)  Overweight  (ICD-278.02) 3)  Dysuria  (ICD-788.1) 4)  Sinusitis, Acute  (ICD-461.9) 5)  Sinus Pain  (ICD-478.19) 6)  Insomnia  (ICD-780.52) 7)  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) 8)  Tension Headache  (ICD-307.81) 9)  Neck Pain  (ICD-723.1) 10)  Cough  (ICD-786.2) 11)  Wheezing  (ICD-786.07) 12)  Acute Bronchitis  (ICD-466.0) 13)  Cystitis, Acute  (ICD-595.0) 14)  Pharyngitis, Streptococcal, Acute  (ICD-034.0) 15)  Add  (ICD-314.00) 16)  Depression  (ICD-311) 17)  Allergic Rhinitis  (ICD-477.9)  Current Problems (verified): 1)  Overweight  (ICD-278.02) 2)  Dysuria  (ICD-788.1) 3)  Sinusitis, Acute  (ICD-461.9) 4)  Sinus Pain  (ICD-478.19) 5)  Insomnia  (ICD-780.52) 6)  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 1 Once Daily 2)  Pristiq 50 Mg Xr24h-Tab (Desvenlafaxine Succinate) .Marland Kitchen.. 1 Once Daily 3)  Adderall Xr 20 Mg  Cp24 (Amphetamine-Dextroamphetamine) .... Once Daily 4)  Adderall 10 Mg  Tabs (Amphetamine-Dextroamphetamine) .... One in Pm 5)  Flexeril 10 Mg Tabs (Cyclobenzaprine Hcl) .... Three Times A Day As Needed Spasm 6)  Zolpidem Tartrate 10 Mg Tabs (Zolpidem Tartrate) .Marland Kitchen.. 1 At Bedtime As Needed 7)  Valacyclovir Hcl 500 Mg Tabs (Valacyclovir Hcl) .... One By Mouth Daily As Needed 8)  Nasonex 50 Mcg/act Susp (Mometasone Furoate) .... Two Spray in Nostril Daily 9)  Pyridium 200 Mg Tabs (Phenazopyridine Hcl) .Marland Kitchen.. 1 Tab By Mouth Three Times A Day 2 Days 10)  Keflex 500 Mg Caps (Cephalexin) .... One Tab Three Times A Day  Current Medications (verified): 1)  Fexofenadine-Pseudoephedrine 60-120 Mg Xr12h-Tab (Fexofenadine-Pseudoephedrine) .Marland Kitchen.. 1 Once Daily 2)  Pristiq 50 Mg Xr24h-Tab (Desvenlafaxine Succinate) .Marland Kitchen.. 1 Once Daily 3)  Vyvanse 50 Mg Caps (Lisdexamfetamine Dimesylate) .... One By Mouth Q Am 4)  Adderall 10 Mg  Tabs (Amphetamine-Dextroamphetamine) .... One in Pm 5)  Flexeril 10 Mg Tabs (Cyclobenzaprine Hcl) .... Three  Times A Day As Needed Spasm 6)  Zolpidem Tartrate 10 Mg Tabs (Zolpidem Tartrate) .Marland Kitchen.. 1 At Bedtime As Needed 7)  Valacyclovir Hcl 500 Mg Tabs (Valacyclovir Hcl) .... One By Mouth Daily As Needed 8)  Nasonex 50 Mcg/act Susp (Mometasone Furoate) .... Two Spray in Nostril Daily  Allergies (verified): 1)  Septra  Past History:  Risk Factors: Smoking Status: never (07/24/2010)  Past medical, surgical, family and social histories (including risk factors) reviewed, and no  changes noted (except as noted below).  Past Medical History: Reviewed history from 02/21/2010 and no changes required. Allergic rhinitis Depression mono as an adult chronic sinusitis  Past Surgical History: Reviewed history from 04/29/2007 and no changes required. Caesarean section  Family History: Reviewed history and no changes required.  Social History: Reviewed history and no changes required.  Review of Systems  The patient denies anorexia, fever, weight loss, weight gain, vision loss, decreased hearing, hoarseness, chest pain, syncope, dyspnea on exertion, peripheral edema, prolonged cough, headaches, hemoptysis, abdominal pain, melena, hematochezia, severe indigestion/heartburn, hematuria, incontinence, genital sores, muscle weakness, suspicious skin lesions, transient blindness, difficulty walking, depression, unusual weight change, abnormal bleeding, enlarged lymph nodes, angioedema, and breast masses.    Physical Exam  General:  Well-developed,well-nourished,in no acute distress; alert,appropriate and cooperative throughout examination Lungs:  Normal respiratory effort, chest expands symmetrically. Lungs are clear to auscultation, no crackles or wheezes. Heart:  Normal rate and regular rhythm. S1 and S2 normal without gallop, murmur, click, rub or other extra sounds.   Impression & Recommendations:  Problem # 1:  NEOPLASM, SKIN, UNCERTAIN BEHAVIOR (ICD-238.2) Assessment New  pt was prepped in a sterile manor and informed consent obtained. Using a 15 blade the lesion was removed and sent for pathology. sterile dressings were applied  and wound care discussed with the pt.  Orders: Excise Malig Lesion (TAL) 0 - 0.5 cm (11600) the path shows melanocytic atypia and she will need six month exams  Problem # 2:  ADD (ICD-314.00) vyance trial  Complete Medication List: 1)  Fexofenadine-pseudoephedrine 60-120 Mg Xr12h-tab (Fexofenadine-pseudoephedrine) .Marland Kitchen.. 1 once  daily 2)  Pristiq 50 Mg Xr24h-tab (Desvenlafaxine succinate) .Marland Kitchen.. 1 once daily 3)  Vyvanse 50 Mg Caps (Lisdexamfetamine dimesylate) .... One by mouth q am 4)  Adderall 10 Mg Tabs (Amphetamine-dextroamphetamine) .... One in pm 5)  Flexeril 10 Mg Tabs (Cyclobenzaprine hcl) .... Three times a day as needed spasm 6)  Zolpidem Tartrate 10 Mg Tabs (Zolpidem tartrate) .Marland Kitchen.. 1 at bedtime as needed 7)  Valacyclovir Hcl 500 Mg Tabs (Valacyclovir hcl) .... One by mouth daily as needed 8)  Nasonex 50 Mcg/act Susp (Mometasone furoate) .... Two spray in nostril daily  Patient Instructions: 1)  Please schedule a follow-up appointment in 3 months. Prescriptions: VYVANSE 50 MG CAPS (LISDEXAMFETAMINE DIMESYLATE) one by mouth q AM  #30 x 0   Entered and Authorized by:   Stacie Glaze MD   Signed by:   Stacie Glaze MD on 07/24/2010   Method used:   Print then Give to Patient   RxID:   [id number] VYVANSE 50 MG CAPS (LISDEXAMFETAMINE DIMESYLATE) one by mouth q AM  #30 x 0   Entered and Authorized by:   Stacie Glaze MD   Signed by:   Stacie Glaze MD on 07/24/2010   Method used:   Print then Give to Patient   RxID:   [id number] VYVANSE 50 MG CAPS (LISDEXAMFETAMINE DIMESYLATE) one by mouth q AM  #30 x 0  Entered and Authorized by:   Stacie Glaze MD   Signed by:   Stacie Glaze MD on 07/24/2010   Method used:   Print then Give to Patient   RxID:   [id number]    Orders Added: 1)  Est. Patient Level III [14782] 2)  Excise Malig Lesion (TAL) 0 - 0.5 cm [11600]

## 2010-10-22 NOTE — Progress Notes (Signed)
Summary: Pt req scripts for Adderall XR 20mg  and Adderall 10mg   Phone Note Call from Patient Call back at [facility] Phone [phone]   Caller: Patient Summary of Call: Pt called req script for Adderall XR 20mg  and Adderall 10mg . 90 day supply on both.  Initial call taken by: Lucy Antigua,  January 03, 2010 11:39 AM    Prescriptions: ADDERALL 10 MG  TABS (AMPHETAMINE-DEXTROAMPHETAMINE) one in pm  #90 x 0   Entered by:   Willy Eddy, LPN   Authorized by:   Stacie Glaze MD   Signed by:   Willy Eddy, LPN on 07/19/2535   Method used:   Print then Give to Patient   RxID:   [id number] ADDERALL XR 20 MG  CP24 (AMPHETAMINE-DEXTROAMPHETAMINE) once daily  #90 x 0   Entered by:   Willy Eddy, LPN   Authorized by:   Stacie Glaze MD   Signed by:   Willy Eddy, LPN on 56/43/3295   Method used:   Print then Give to Patient   RxID:   [id number]

## 2010-10-22 NOTE — Progress Notes (Signed)
Summary: new rxs needed  Phone Note Call from Patient Call back at Home Phone [phone]   Caller: Patient---live call Summary of Call: Her ins is requiring 90 day supply of her vyvanse. she will bring in the ones from yesterday. also she will need a 30 day rx for generic allegra D instead of the 90 day supply. She wants Bonnye to call her if this sounds confusing. Initial call taken by: Warnell Forester,  July 25, 2010 9:22 AM    Prescriptions: FEXOFENADINE-PSEUDOEPHEDRINE 60-120 MG XR12H-TAB (FEXOFENADINE-PSEUDOEPHEDRINE) 1 once daily  #30 x 11   Entered by:   Willy Eddy, LPN   Authorized by:   Stacie Glaze MD   Signed by:   Willy Eddy, LPN on 56/81/2751   Method used:   Print then Give to Patient   RxID:   [id number] VYVANSE 50 MG CAPS (LISDEXAMFETAMINE DIMESYLATE) one by mouth q AM  #90 x 0   Entered by:   Willy Eddy, LPN   Authorized by:   Stacie Glaze MD   Signed by:   Willy Eddy, LPN on 38/46/6599   Method used:   Print then Give to Patient   RxID:   [id number]

## 2010-10-22 NOTE — Progress Notes (Signed)
Summary: refill  Phone Note Refill Request Call back at Home Phone [phone] Message from:  Patient---live call  Refills Requested: Medication #1:  ADDERALL XR 20 MG  CP24 once daily  Medication #2:  ADDERALL 10 MG  TABS one in pm needs 90 day supply easch. please call when ready.  Initial call taken by: Warnell Forester,  July 17, 2010 1:04 PM    Prescriptions: ADDERALL 10 MG  TABS (AMPHETAMINE-DEXTROAMPHETAMINE) one in pm  #90 x 0   Entered by:   Willy Eddy, LPN   Authorized by:   Stacie Glaze MD   Signed by:   Willy Eddy, LPN on 82/95/6213   Method used:   Print then Give to Patient   RxID:   [id number] ADDERALL XR 20 MG  CP24 (AMPHETAMINE-DEXTROAMPHETAMINE) once daily  #90 x 0   Entered by:   Willy Eddy, LPN   Authorized by:   Stacie Glaze MD   Signed by:   Willy Eddy, LPN on 13/24/4010   Method used:   Print then Give to Patient   RxID:   [id number]

## 2010-10-22 NOTE — Progress Notes (Signed)
Summary: sinus again  Phone Note Call from Patient Call back at Home Phone [phone]   Caller: vm Call For: Kristi Hill Summary of Call: Took up antibiotic for sinus infection after ov a couple weeks ago.  Was better a couple days.  Now all back again.  No fever.  Sinus headache, the nose, everything like that.   Another round or different antibiotic requested.  Please call back.  Initial call taken by: Rudy Jew, RN,  October 22, 2009 2:25 PM  Follow-up for Phone Call        per dr Lovell Sheehan-- needs sinus ct Follow-up by: Willy Eddy, LPN,  October 22, 2009 2:28 PM  Additional Follow-up for Phone Call Additional follow up Details #1::        Pt called and said she does not want this done now. Additional Follow-up by: Corky Mull,  October 24, 2009 12:36 PM  New Problems: SINUS PAIN (ICD-478.19)   New Problems: SINUS PAIN (ICD-478.19)  Pt. notified.

## 2010-10-22 NOTE — Assessment & Plan Note (Signed)
Summary: RE-EVAL OF SINUSITIS / ALLERGIES? // RS   Vital Signs:  Patient profile:   40 year old female Height:      70 inches Weight:      225 pounds BMI:     32.40 Temp:     99 degrees F oral Pulse rate:   76 / minute Resp:     14 per minute BP sitting:   130 / 80  (left arm)  Vitals Entered By: Willy Eddy, LPN (February 21, 1609 9:15 AM) CC: saw Dr Caryl Never in May for sinusitis- completed biaxin but still symptomatic, URI symptoms   Primary Care Provider:  Stacie Glaze MD  CC:  saw Dr Caryl Never in May for sinusitis- completed biaxin but still symptomatic and URI symptoms.  History of Present Illness: bad taste in mouth  URI Symptoms      This is a 40 year old woman who presents with URI symptoms.  chronic sinus infection and allergies had windows open and increased pollen exposure.  The patient reports nasal congestion, purulent nasal discharge, sore throat, and productive cough, but denies clear nasal discharge, dry cough, earache, and sick contacts.  The patient denies fever, low-grade fever (<100.5 degrees), fever of 100.5-103 degrees, fever of 103.1-104 degrees, fever to >104 degrees, stiff neck, dyspnea, wheezing, rash, vomiting, diarrhea, use of an antipyretic, and response to antipyretic.  The patient also reports sneezing, seasonal symptoms, headache, and severe fatigue.  Risk factors for Strep sinusitis include unilateral facial pain.  The patient denies the following risk factors for Strep sinusitis: unilateral nasal discharge, poor response to decongestant, double sickening, tooth pain, Strep exposure, tender adenopathy, and absence of cough.    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) 5)  Tension Headache  (ICD-307.81) 6)  Neck Pain   (ICD-723.1) 7)  Cough  (ICD-786.2) 8)  Wheezing  (ICD-786.07) 9)  Acute Bronchitis  (ICD-466.0) 10)  Cystitis, Acute  (ICD-595.0) 11)  Pharyngitis, Streptococcal, Acute  (ICD-034.0) 12)  Add  (ICD-314.00) 13)  Depression  (ICD-311) 14)  Allergic Rhinitis  (ICD-477.9)  Current Problems (verified): 1)  Sinusitis, Acute  (ICD-461.9) 2)  Sinus Pain  (ICD-478.19) 3)  Insomnia  (ICD-780.52) 4)  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 One By Mouth Bid 2)  Pristiq 100 Mg Xr24h-Tab (Desvenlafaxine Succinate) .... One By Mouth Daily 3)  Adderall Xr 20 Mg  Cp24 (Amphetamine-Dextroamphetamine) .... Once Daily 4)  Adderall 10 Mg  Tabs (Amphetamine-Dextroamphetamine) .... One in Pm 5)  Nasacort Aq 55 Mcg/act Aers (Triamcinolone Acetonide(Nasal)) .... Two Sprays Q Nostril Q Day 6)  Bromfed 12-15 Mg Xr12h-Cap (Brompheniramine-Phenylephrine) .Marland Kitchen.. 1 Two Times A Day For 10 Days 7)  Diclofenac Sodium 50 Mg Tbec (Diclofenac Sodium) .... Three Times A Day As Needed Pain 8)  Flexeril 10 Mg Tabs (Cyclobenzaprine Hcl) .... Three Times A Day As  Needed Spasm 9)  Zolpidem Tartrate 10 Mg Tabs (Zolpidem Tartrate) .Marland Kitchen.. 1 At Bedtime As Needed 10)  Valacyclovir Hcl 500 Mg Tabs (Valacyclovir Hcl) .... One By Mouth Daily 11)  Clarithromycin 500 Mg Xr24h-Tab (Clarithromycin) .... One By Mouth Two Times A Day For 10 Days  Current Medications (verified): 1)  Fexofenadine-Pseudoephedrine 60-120 Mg Xr12h-Tab (Fexofenadine-Pseudoephedrine) .Marland Kitchen.. 1 Once Daily 2)   Pristiq 50 Mg Xr24h-Tab (Desvenlafaxine Succinate) .Marland Kitchen.. 1 Once Daily 3)  Adderall Xr 20 Mg  Cp24 (Amphetamine-Dextroamphetamine) .... Once Daily 4)  Adderall 10 Mg  Tabs (Amphetamine-Dextroamphetamine) .... One in Pm 5)  Flexeril 10 Mg Tabs (Cyclobenzaprine Hcl) .... Three Times A Day As Needed Spasm 6)  Zolpidem Tartrate 10 Mg Tabs (Zolpidem Tartrate) .Marland Kitchen.. 1 At Bedtime As Needed 7)  Valacyclovir Hcl 500 Mg Tabs (Valacyclovir Hcl) .... One By Mouth Daily As Needed 8)  Nasonex 50 Mcg/act Susp (Mometasone Furoate) .... Two Spray in Nostril Daily 9)  Amoxicillin 500 Mg Tabs (Amoxicillin) .... One By Mouth Three Times A Day For 10 Days  Allergies (verified): No Known Drug Allergies  Past History:  Risk Factors: Smoking Status: never (02/21/2010)  Past medical, surgical, family and social histories (including risk factors) reviewed, and no changes noted (except as noted below).  Past Medical History: Allergic rhinitis Depression mono as an adult chronic sinusitis  Past Surgical History: Reviewed history from 04/29/2007 and no changes required. Caesarean section  Family History: Reviewed history and no changes required.  Social History: Reviewed history and no changes required. Smoking Status:  never  Review of Systems  The patient denies anorexia, fever, weight loss, weight gain, vision loss, decreased hearing, hoarseness, chest pain, syncope, dyspnea on exertion, peripheral edema, prolonged cough, headaches, hemoptysis, abdominal pain, melena, hematochezia, severe indigestion/heartburn, hematuria, incontinence, genital sores, muscle weakness, suspicious skin lesions, transient blindness, difficulty walking, depression, unusual weight change, abnormal bleeding, enlarged lymph nodes, angioedema, and breast masses.    Physical Exam  General:  Well-developed,well-nourished,in no acute distress; alert,appropriate and cooperative throughout examination Head:  Normocephalic and  atraumatic without obvious abnormalities. No apparent alopecia or balding. Eyes:  pupils equal and pupils round.   Ears:  R ear normal and L ear normal.   Nose:  nasal dischargemucosal pallor, mucosal edema, and airflow obstruction.   Mouth:  posterior lymphoid hypertrophy and postnasal drip.   Neck:  No deformities, masses, or tenderness noted. Lungs:  Normal respiratory effort, chest expands symmetrically. Lungs are clear to auscultation, no crackles or wheezes. Heart:  Normal rate and regular rhythm. S1 and S2 normal without gallop, murmur, click, rub or other extra sounds. Abdomen:  Bowel sounds positive,abdomen soft and non-tender without masses, organomegaly or hernias noted.   Impression & Recommendations:  Problem # 1:  SINUS PAIN (ICD-478.19) from allergies and chronic sinusitis  Problem # 2:  ALLERGIC RHINITIS (ICD-477.9)  The following medications were removed from the medication list:    Nasacort Aq 55 Mcg/act Aers (Triamcinolone acetonide(nasal)) .Marland Kitchen..Marland Kitchen Two sprays q nostril q day Her updated medication list for this problem includes:    Nasonex 50 Mcg/act Susp (Mometasone furoate) .Marland Kitchen..Marland Kitchen Two spray in nostril daily  Discussed use of allergy medications and environmental measures.   Complete Medication List: 1)  Fexofenadine-pseudoephedrine 60-120 Mg Xr12h-tab (Fexofenadine-pseudoephedrine) .Marland Kitchen.. 1 once daily 2)  Pristiq 50 Mg Xr24h-tab (Desvenlafaxine succinate) .Marland Kitchen.. 1 once daily 3)  Adderall Xr 20 Mg Cp24 (Amphetamine-dextroamphetamine) .... Once daily 4)  Adderall 10 Mg Tabs (Amphetamine-dextroamphetamine) .... One in pm 5)  Flexeril 10  Mg Tabs (Cyclobenzaprine hcl) .... Three times a day as needed spasm 6)  Zolpidem Tartrate 10 Mg Tabs (Zolpidem tartrate) .Marland Kitchen.. 1 at bedtime as needed 7)  Valacyclovir Hcl 500 Mg Tabs (Valacyclovir hcl) .... One by mouth daily as needed 8)  Nasonex 50 Mcg/act Susp (Mometasone furoate) .... Two spray in nostril daily 9)  Amoxicillin 500 Mg  Tabs (Amoxicillin) .... One by mouth three times a day for 10 days  Patient Instructions: 1)  mole removal  for adominal mole 2)  Please schedule a follow-up appointment in 1 month. Prescriptions: FEXOFENADINE-PSEUDOEPHEDRINE 60-120 MG XR12H-TAB (FEXOFENADINE-PSEUDOEPHEDRINE) 1 once daily  #60 x 11   Entered and Authorized by:   Stacie Glaze MD   Signed by:   Stacie Glaze MD on 02/21/2010   Method used:   Electronically to        [facility] [phone][facility] (retail)       [address]       [place], [place]  [address]       Ph: [phone]       Fax: [phone]   RxID:   [id number] AMOXICILLIN 500 MG TABS (AMOXICILLIN) one by mouth three times a day for 10 days  #30 x 0   Entered and Authorized by:   Stacie Glaze MD   Signed by:   Stacie Glaze MD on 02/21/2010   Method used:   Electronically to        [facility] [phone]* (retail)       [address]       [place], [place]  [address]       Ph: [phone]       Fax: [phone]   RxID:   [id number] NASONEX 50 MCG/ACT SUSP (MOMETASONE FUROATE) two spray in nostril daily  #1 x 11   Entered and Authorized by:   Stacie Glaze MD   Signed by:   Stacie Glaze MD on 02/21/2010   Method used:   [facility] to        [facility] [phone]* (retail)       [address]       [place], [place]  [address]       Ph: [phone]       Fax: [phone]   RxID:   [id number]

## 2010-11-29 ENCOUNTER — Other Ambulatory Visit: Payer: Self-pay | Admitting: Internal Medicine

## 2011-01-21 ENCOUNTER — Other Ambulatory Visit: Payer: Self-pay | Admitting: Internal Medicine

## 2011-01-21 NOTE — Telephone Encounter (Signed)
Pt called and need 90 day supply on Adderall 20 mg XR and also on Adderall 10 mg.

## 2011-01-22 NOTE — Telephone Encounter (Signed)
Left message on machine For pt- no record of ritalin 10 mg- who ordered it?

## 2011-01-23 ENCOUNTER — Other Ambulatory Visit: Payer: Self-pay | Admitting: *Deleted

## 2011-01-23 MED ORDER — AMPHETAMINE-DEXTROAMPHET ER 20 MG PO CP24: 20.0000 mg | ORAL_CAPSULE | ORAL | Status: DC

## 2011-01-23 MED ORDER — AMPHETAMINE-DEXTROAMPHETAMINE 10 MG PO TABS: 10.0000 mg | ORAL_TABLET | Freq: Every day | ORAL | Status: DC

## 2011-01-23 NOTE — Telephone Encounter (Signed)
done

## 2011-02-07 NOTE — Discharge Summary (Signed)
[facility]  Patient:    Kristi Hill, DICKERSON                      MRN: [id number] Adm. Date:  [id number] Disc. Date: [id number] Attending:  Cordelia Pen Ii Dictator:   Danie Chandler, R.N.                           Discharge Summary  ADMISSION DIAGNOSIS:          Intrauterine pregnancy at term in active labor.  DISCHARGE DIAGNOSES:          1. Intrauterine pregnancy at term in active labor.                               2. Arrest of descent.                               3. Persistent occipitoposterior presentation.                               4. Anemia.  PROCEDURES:                   On December 02, 1999, primary low transverse cesarean section.  HISTORY OF PRESENT ILLNESS:   The patient is a 40 year old, married, white female, gravida 2, para 0, with an estimated date of confinement of December 04, 1999. The patient was admitted on the morning of December 02, 1999, with active uterine contractions.  Artificial rupture of membranes was carried out that afternoon revealing clear fluid.  The cervix at that time was 8 cm dilated, completely effaced, and -1 station.  The patient progressed to complete pushing with a left occipitotransverse presentation.  After more than one hour of pushing, there was no further descent of the vertex.  A recommendation for cesarean section was made.  HOSPITAL COURSE:              The patient is taken to the operating room and undergoes the above-named procedure without complication.  This is productive of a viable female infant with Apgars of 4 at one minute and 9 at five minutes and an arterial cord pH of 7.30.  Postoperatively, the patient does well.  On postoperative day #1, the patients hemoglobin was 8.4, hematocrit 25.8, and white blood cell count 13.7.  The patient was started on iron daily and had a repeat BC ordered for the next morning.  The patient had good return of bowel function on  this day and  was tolerating a regular diet.  She was also ambulating well without difficulty and had good pain control.  On postoperative day #2, her hemoglobin as stable at 8.1.  She was continued on iron daily.  On postoperative day #3, the patient was complaining of being sore and was not discharged home until postoperative day #4.  CONDITION ON DISCHARGE:       Good.  DIET:                         Regular as tolerated.  ACTIVITY:                     No heavy lifting.  No driving.  No vaginal entry.  FOLLOW-UP:                    She is to follow up in the office in one to two weeks for an incision check.  She is to call for a temperature greater than 100 degrees, persistent nausea or vomiting, heavy vaginal bleeding, and/or redness or drainage from the incision site.  DISCHARGE MEDICATIONS: 1. Prenatal vitamins one p.o. q.d. 2. Nu-Iron 150 mg one p.o. q.d. 3. Tylox #40 one to two p.o. q.4h. p.r.n. pain. 4. Motrin 600 mg #30 one p.o. q.6h. p.r.n. pain. 5. Effexor XR 75 mg one p.o. q.d. DD:  12/06/99 TD:  12/08/99 Job: [id number]

## 2011-02-07 NOTE — Op Note (Signed)
[facility]  Patient:    Kristi Hill, Kristi Hill                      MRN: [id number] Proc. Date: 12/02/99 Adm. Date:  [id number] Attending:  Cordelia Pen Ii                           Operative Report  PREOPERATIVE DIAGNOSES:       1. Intrauterine pregnancy at 39-5/7 weeks.                               2. Arrest of descent.                               3. Persistent occipitoposterior presentation.  POSTOPERATIVE DIAGNOSES:      1. Intrauterine pregnancy at 39-5/7 weeks.                               2. Arrest of descent.                               3. Persistent occipitoposterior presentation.  PROCEDURE:                    Low transverse cesarean section.  SURGEON:                      Guy Sandifer. Arleta Creek, M.D.  ANESTHESIA:                   Epidural, Donald T. Pamalee Leyden, M.D.  ESTIMATED BLOOD LOSS:         800 cc.  FINDINGS:                     Viable female infant, Apgars of 4/9 at one and five minutes, respectively, birth weight 8 pounds 5 ounces, arterial cord pH 7.30.  INDICATIONS AND CONSENT:      Patient is a 40 year old married white female, G2, P0, Ab1, EDC of December 04, 1999, with essentially uncomplicated prenatal care. Group B beta strep culture is positive.  Patient was admitted this a.m. with active uterine contractions.  Artificial rupture of membranes for clear fluid was carried out at 4 p.m.  Cervix was 8-cm dilated, completely effaced and -1 station at that time.  Patient progressed to complete and pushing with a left occipitotransverse presentation at 10:50 p.m.  After greater than one hour of pushing with excellent effort, there was absolutely no further descent of the vertex; it remained occipitoposterior.  Recommendation for cesarean section was made.  Possible risks and complications were discussed with the patient and her husband including, but not limited to, infection, organ damage, bleeding requiring  transfusion of blood products with possible transfusion reaction, HIV and hepatitis acquisition, DVT, PE, pneumonia and wound breakdown.  All questions were answered and consent is signed and on the chart.  DESCRIPTION OF PROCEDURE:     Patient is taken to the operating room where epidural anesthetic is augmented to a surgical level.  Foley catheter is already in place. She is placed in a dorsal supine position with a 15-degree left lateral wedge. She is prepped abdominally and draped  in a sterile fashion.  After testing for adequate epidural anesthesia, the skin is entered through a Pfannenstiel incision and dissection is carried out in layers to the peritoneum.  Peritoneum is sharply incised and extended superiorly and inferiorly.  Vesicouterine peritoneum is taken down cephalolaterally, bladder flap is developed and the bladder blade is placed. Uterus is incised in a low transverse manner and the uterine cavity is entered bluntly with a hemostat.  The incision is extended cephalolaterally.  Infant is  noted to be in the LOT position.  Vertex is then delivered.  Oro and nasopharynx is suctioned, cord is clamped and cut and the infant is handed to the awaiting pediatric team.  Placenta is manually delivered and sent to pathology. Internal uterine contour is normal.  There is a 2-cm extension of the uterine incision on the right aspect in the lower uterine segment.  This is first repaired in a running-locking manner with 0 Monocryl suture.  The uterine incision was then closed in a running-locked fashion with 0 Monocryl suture; good hemostasis is noted.  Tubes and ovaries are normal bilaterally.  There is a 2-cm subserosal fibroid on the anterior uterine fundus.  The anterior peritoneum is then closed in running fashion with 0 Monocryl suture, which is also used to reapproximate the  pyramidalis muscle in the midline.  Anterior rectus fascia is then closed in a running fashion  with 0 PDS suture and the skin is closed with clips.  All sponge, instrument and needle counts are correct and the patient is transferred to the recovery room in stable condition.  DD:  12/02/99 TD:  12/02/99 Job: [id number]

## 2011-02-07 NOTE — Assessment & Plan Note (Signed)
[facility]                                 ON-CALL NOTE   NAME:[name], [name]                      MRN:          [id number]  DATE:02/26/2008                            DOB:          1971-06-23    PHONE NUMBER:  [phone]   Dr. Lovell Sheehan is her regular doctor and Dr. Milinda Antis on call.   CHIEF COMPLAINT:  Fever blister.   The patient states she woke up with beginning of a fever blister.  She  usually takes Valtrex at the beginning of the course, so it does not get  as severe.  She is out of her Valtrex; however, needs a refill.  She  said she has a followup with Dr. Lovell Sheehan coming up soon, and will get  her usual medicines refilled.   She has no known drug allergies.   I called in Valtrex 500 mg 1 p.o. b.i.d. x5 days #10 with [facility] refills to  [phone], and told her if her symptoms worsen or anything  changes to call back, otherwise as scheduled, follow up with Dr.  Lovell Sheehan.     [name], MD  Electronically Signed    MAT/MedQ  DD: 02/26/2008  DT: 02/27/2008  Job #: [id number]

## 2011-02-11 ENCOUNTER — Other Ambulatory Visit: Payer: Self-pay | Admitting: Internal Medicine

## 2011-04-23 ENCOUNTER — Telehealth: Payer: Self-pay | Admitting: Internal Medicine

## 2011-04-23 NOTE — Telephone Encounter (Signed)
Pt needs refills on.. amphetamine-dextroamphetamine (ADDERALL XR, 20MG ,) 20 MG 24 hr capsule  amphetamine-dextroamphetamine (ADDERALL, 10MG ,) 10 MG tablet

## 2011-04-24 ENCOUNTER — Other Ambulatory Visit: Payer: Self-pay | Admitting: *Deleted

## 2011-04-24 MED ORDER — AMPHETAMINE-DEXTROAMPHETAMINE 10 MG PO TABS: 10.0000 mg | ORAL_TABLET | Freq: Every day | ORAL | Status: DC

## 2011-04-24 MED ORDER — AMPHETAMINE-DEXTROAMPHET ER 20 MG PO CP24: 20.0000 mg | ORAL_CAPSULE | ORAL | Status: DC

## 2011-04-24 NOTE — Telephone Encounter (Signed)
Left message on machine for -scripts ready for pickup

## 2011-04-25 ENCOUNTER — Other Ambulatory Visit: Payer: Self-pay | Admitting: Internal Medicine

## 2011-05-23 ENCOUNTER — Other Ambulatory Visit: Payer: Self-pay | Admitting: Internal Medicine

## 2011-06-17 ENCOUNTER — Other Ambulatory Visit: Payer: Self-pay | Admitting: *Deleted

## 2011-06-17 MED ORDER — ZOLPIDEM TARTRATE 10 MG PO TABS: 10.0000 mg | ORAL_TABLET | Freq: Every evening | ORAL | Status: DC | PRN

## 2011-07-23 ENCOUNTER — Other Ambulatory Visit: Payer: Self-pay | Admitting: Internal Medicine

## 2011-08-18 ENCOUNTER — Other Ambulatory Visit: Payer: Self-pay | Admitting: Internal Medicine

## 2011-08-18 MED ORDER — AMPHETAMINE-DEXTROAMPHET ER 20 MG PO CP24: 20.0000 mg | ORAL_CAPSULE | ORAL | Status: DC

## 2011-08-18 MED ORDER — AMPHETAMINE-DEXTROAMPHETAMINE 10 MG PO TABS: 10.0000 mg | ORAL_TABLET | Freq: Every day | ORAL | Status: DC

## 2011-08-18 NOTE — Telephone Encounter (Signed)
Ready for pick up

## 2011-08-18 NOTE — Telephone Encounter (Signed)
Pt called req 90 day supply refills for both amphetamine-dextroamphetamine (ADDERALL XR, 20MG ,) 20 MG 24 hr capsule and for amphetamine-dextroamphetamine (ADDERALL, 10MG ,) 10 MG tablet

## 2011-10-06 ENCOUNTER — Other Ambulatory Visit: Payer: Self-pay | Admitting: *Deleted

## 2011-10-06 MED ORDER — ZOLPIDEM TARTRATE 10 MG PO TABS: 10.0000 mg | ORAL_TABLET | Freq: Every evening | ORAL | Status: DC | PRN

## 2011-10-24 ENCOUNTER — Other Ambulatory Visit: Payer: Self-pay | Admitting: Internal Medicine

## 2011-11-12 ENCOUNTER — Other Ambulatory Visit: Payer: Self-pay | Admitting: Internal Medicine

## 2011-11-25 ENCOUNTER — Telehealth: Payer: Self-pay | Admitting: *Deleted

## 2011-11-25 NOTE — Telephone Encounter (Signed)
Requesting rx refill of adderral 20 mg extended release and adderral 10 mg (90day supply).  Pt states she is out of meds and if at all possible would like to pick up this afternoon.

## 2011-11-25 NOTE — Telephone Encounter (Signed)
Pt hasnot had ov since 11-11- needs ov before per dr Lovell Sheehan.  Left message on machine For pt

## 2011-11-28 ENCOUNTER — Ambulatory Visit (INDEPENDENT_AMBULATORY_CARE_PROVIDER_SITE_OTHER): Payer: BC Managed Care – PPO | Admitting: Internal Medicine

## 2011-11-28 ENCOUNTER — Telehealth: Payer: Self-pay | Admitting: Internal Medicine

## 2011-11-28 ENCOUNTER — Encounter: Payer: Self-pay | Admitting: Internal Medicine

## 2011-11-28 DIAGNOSIS — B009 Herpesviral infection, unspecified: Secondary | ICD-10-CM

## 2011-11-28 DIAGNOSIS — J300 Vasomotor rhinitis: Secondary | ICD-10-CM

## 2011-11-28 DIAGNOSIS — J309 Allergic rhinitis, unspecified: Secondary | ICD-10-CM

## 2011-11-28 DIAGNOSIS — E663 Overweight: Secondary | ICD-10-CM

## 2011-11-28 DIAGNOSIS — B001 Herpesviral vesicular dermatitis: Secondary | ICD-10-CM

## 2011-11-28 DIAGNOSIS — F329 Major depressive disorder, single episode, unspecified: Secondary | ICD-10-CM

## 2011-11-28 DIAGNOSIS — F988 Other specified behavioral and emotional disorders with onset usually occurring in childhood and adolescence: Secondary | ICD-10-CM

## 2011-11-28 MED ORDER — VALACYCLOVIR HCL 500 MG PO TABS: 500.0000 mg | ORAL_TABLET | Freq: Two times a day (BID) | ORAL | Status: DC

## 2011-11-28 MED ORDER — AMPHETAMINE-DEXTROAMPHETAMINE 10 MG PO TABS: 10.0000 mg | ORAL_TABLET | Freq: Every day | ORAL | Status: DC

## 2011-11-28 MED ORDER — AMPHETAMINE-DEXTROAMPHET ER 20 MG PO CP24: 20.0000 mg | ORAL_CAPSULE | ORAL | Status: DC

## 2011-11-28 MED ORDER — LEVOFLOXACIN 500 MG PO TABS: 500.0000 mg | ORAL_TABLET | Freq: Every day | ORAL | Status: AC

## 2011-11-28 MED ORDER — DESVENLAFAXINE SUCCINATE ER 50 MG PO TB24: 50.0000 mg | ORAL_TABLET | Freq: Every day | ORAL | Status: DC

## 2011-11-28 MED ORDER — MOMETASONE FUROATE 50 MCG/ACT NA SUSP: 2.0000 | Freq: Every day | NASAL | Status: DC

## 2011-11-28 NOTE — Patient Instructions (Signed)
The patient is instructed to continue all medications as prescribed. Schedule followup with check out clerk upon leaving the clinic  

## 2011-11-28 NOTE — Telephone Encounter (Signed)
Per dr Lovell Sheehan- l evaquin 500 qd for 7 days called to pharmacy

## 2011-11-28 NOTE — Telephone Encounter (Signed)
Called to pharmacy 

## 2011-11-28 NOTE — Telephone Encounter (Signed)
Pt called said that [facility] [address] in [place], and no abx was sent in by pcp. Pt is req that abx be called in asap. Pt would like to be notified when this has been done.

## 2011-11-28 NOTE — Progress Notes (Signed)
Subjective:    Patient ID: Kristi Hill, female    DOB: 1971-05-05, 41 y.o.   MRN: [id number]  HPI Has lost 12 pounds She is following a program of exercise walking the treadmill at this resulted in consistent progressive weight loss. The pristiq has helped She started with upper aspirate tract symptoms 2 weeks ago.  These have progressed to sinus congestion and pressure in the sinus area.  With a history of sinus infection   Review of Systems  Constitutional: Negative for activity change, appetite change and fatigue.  HENT: Positive for congestion, rhinorrhea and postnasal drip. Negative for ear pain, neck pain and sinus pressure.   Eyes: Negative for redness and visual disturbance.  Respiratory: Positive for cough. Negative for shortness of breath and wheezing.   Gastrointestinal: Negative for abdominal pain and abdominal distention.  Genitourinary: Negative for dysuria, frequency and menstrual problem.  Musculoskeletal: Negative for myalgias, joint swelling and arthralgias.  Skin: Negative for rash and wound.  Neurological: Negative for dizziness, weakness and headaches.  Hematological: Negative for adenopathy. Does not bruise/bleed easily.  Psychiatric/Behavioral: Negative for sleep disturbance and decreased concentration.       Past Medical History  Diagnosis Date  . Allergy   . Depression   . History of mononucleosis   . Chronic sinusitis     History   Social History  . Marital Status: Married    Spouse Name: N/A    Number of Children: N/A  . Years of Education: N/A   Occupational History  . Not on file.   Social History Main Topics  . Smoking status: Never Smoker   . Smokeless tobacco: Never Used  . Alcohol Use: Yes  . Drug Use: No  . Sexually Active: Not on file   Other Topics Concern  . Not on file   Social History Narrative  . No narrative on file    Past Surgical History  Procedure Date  . Cesarean section     No family history on  file.  Allergies  Allergen Reactions  . Sulfamethoxazole W/Trimethoprim     REACTION: hives    No current outpatient prescriptions on file prior to visit.    BP 140/90  Pulse 72  Temp 98.3 F (36.8 C)  Resp 16  Ht 5\' 9"  (1.753 m)  Wt 204 lb (92.534 kg)  BMI 30.13 kg/m2    Objective:   Physical Exam  Vitals reviewed. Constitutional: She is oriented to person, place, and time. She appears well-developed and well-nourished. No distress.  HENT:  Head: Normocephalic and atraumatic.  Right Ear: External ear normal.  Left Ear: External ear normal.  Nose: Nose normal.  Mouth/Throat: Oropharynx is clear and moist.       Her meds are erythematous and red with white mucoid discharge  Eyes: Conjunctivae and EOM are normal. Pupils are equal, round, and reactive to light.  Neck: Normal range of motion. Neck supple. No JVD present. No tracheal deviation present. No thyromegaly present.  Cardiovascular: Normal rate, regular rhythm, normal heart sounds and intact distal pulses.   No murmur heard. Pulmonary/Chest: Effort normal and breath sounds normal. She has no wheezes. She exhibits no tenderness.  Abdominal: Soft. Bowel sounds are normal.  Musculoskeletal: Normal range of motion. She exhibits no edema and no tenderness.  Lymphadenopathy:    She has no cervical adenopathy.  Neurological: She is alert and oriented to person, place, and time. She has normal reflexes. No cranial nerve deficit.  Skin: Skin is warm  and dry. She is not diaphoretic.  Psychiatric: She has a normal mood and affect. Her behavior is normal.          Assessment & Plan:  Patient requires refill of her pristiq the Adderall 10 and the extended-release 20 Valtrex Nasonex. She'll be treated for an acute sinus infection with a course of Levaquin 500 for 7 days We recommend Mucinex fast max cough and cold along with this

## 2011-12-08 ENCOUNTER — Telehealth: Payer: Self-pay

## 2011-12-08 MED ORDER — AZITHROMYCIN 250 MG PO TABS: ORAL_TABLET | ORAL | Status: AC

## 2011-12-08 NOTE — Telephone Encounter (Signed)
Rx sent to pharmacy   

## 2011-12-08 NOTE — Telephone Encounter (Signed)
Per dr jenkins-may have z pack 

## 2011-12-08 NOTE — Telephone Encounter (Signed)
Pt states she was given Levaquin on 11/28/11.  Pt states she had side effects such as muscle cramps, pale skin color, and feeling faint.  Pt states she only took the medication or 3 to 4 days and then she stopped it.  Pt states she has the sinus infection and would like to have [facility] [address].  Pls advise.

## 2011-12-12 ENCOUNTER — Telehealth: Payer: Self-pay | Admitting: *Deleted

## 2011-12-12 MED ORDER — FLUCONAZOLE 150 MG PO TABS: ORAL_TABLET | ORAL | Status: DC

## 2011-12-12 NOTE — Telephone Encounter (Signed)
Per dr Adline Potter feel like levaquin could cause uti, but since taking levaquiin and a z pack feel like it is vaginal yeast infectiojn and will call in diflucan 150 today and repeat in 5 days..Left message on machine for pt-med sent in to [facility]

## 2011-12-12 NOTE — Telephone Encounter (Signed)
Pt thinks the Levaquin gave her an UTI. Complaining of urinary urgency and frequency.  Took Levaquin 5 days, and then went to Zpack.  No yeast symptoms.  No fever, chills.  Would like Dr. Lovell Sheehan to treat her without an office visit.

## 2011-12-19 ENCOUNTER — Other Ambulatory Visit: Payer: Self-pay | Admitting: Internal Medicine

## 2011-12-24 ENCOUNTER — Other Ambulatory Visit: Payer: Self-pay | Admitting: Internal Medicine

## 2012-01-07 ENCOUNTER — Telehealth: Payer: Self-pay | Admitting: *Deleted

## 2012-01-07 NOTE — Telephone Encounter (Signed)
Appt scheduled with Padonda. 

## 2012-01-07 NOTE — Telephone Encounter (Signed)
Per dr Lovell Sheehan - she needs to be seen-

## 2012-01-07 NOTE — Telephone Encounter (Signed)
Per dr Lovell Sheehan- she needs to have ov- he states he would like Padonda to see her

## 2012-01-07 NOTE — Telephone Encounter (Signed)
Pt took the Diflucan after the antibiotics, but her UTI symptoms did not help.  She still has bladder pressure, frequency, bad smell of urine and low back pain.   Would like an antibiotics but cannot take sulfa.

## 2012-01-08 ENCOUNTER — Encounter: Payer: Self-pay | Admitting: Family

## 2012-01-08 ENCOUNTER — Ambulatory Visit (INDEPENDENT_AMBULATORY_CARE_PROVIDER_SITE_OTHER): Payer: BC Managed Care – PPO | Admitting: Family

## 2012-01-08 VITALS — BP 100/80 | HR 115 | Temp 99.0°F

## 2012-01-08 DIAGNOSIS — R350 Frequency of micturition: Secondary | ICD-10-CM

## 2012-01-08 DIAGNOSIS — N390 Urinary tract infection, site not specified: Secondary | ICD-10-CM

## 2012-01-08 LAB — POCT URINALYSIS DIPSTICK
Blood, UA: NEGATIVE
Leukocytes, UA: NEGATIVE
Nitrite, UA: NEGATIVE
Protein, UA: NEGATIVE
pH, UA: 6

## 2012-01-08 MED ORDER — CIPROFLOXACIN HCL 500 MG PO TABS: 500.0000 mg | ORAL_TABLET | Freq: Two times a day (BID) | ORAL | Status: AC

## 2012-01-08 NOTE — Progress Notes (Signed)
Subjective:    Patient ID: Kristi Hill, female    DOB: 1970/10/12, 41 y.o.   MRN: [id number]  HPI Comments: 41 yo white female presents with c/o urine frequency with low abdominal and low back pressure  x 3 days. Denies hematuria, nausea, vomiting, abdominal pain, urgency, chills, constipation, vaginal discharge, urine odor, or fever. Last incident uncomplicated urinary cystitis a year ago.      Review of Systems  Constitutional: Negative.   Respiratory: Negative.   Cardiovascular: Negative.   Gastrointestinal: Negative.   Genitourinary: Positive for frequency. Negative for dysuria, urgency, hematuria, flank pain, decreased urine volume, vaginal bleeding, vaginal discharge, enuresis, difficulty urinating, genital sores, vaginal pain, menstrual problem, pelvic pain and dyspareunia.   Past Medical History  Diagnosis Date  . Allergy   . Depression   . History of mononucleosis   . Chronic sinusitis     History   Social History  . Marital Status: Married    Spouse Name: N/A    Number of Children: N/A  . Years of Education: N/A   Occupational History  . Not on file.   Social History Main Topics  . Smoking status: Never Smoker   . Smokeless tobacco: Never Used  . Alcohol Use: Yes  . Drug Use: No  . Sexually Active: Not on file   Other Topics Concern  . Not on file   Social History Narrative  . No narrative on file    Past Surgical History  Procedure Date  . Cesarean section     No family history on file.  Allergies  Allergen Reactions  . Sulfamethoxazole W/Trimethoprim     REACTION: hives    Current Outpatient Prescriptions on File Prior to Visit  Medication Sig Dispense Refill  . amphetamine-dextroamphetamine (ADDERALL XR) 20 MG 24 hr capsule Take 1 capsule (20 mg total) by mouth every morning.  90 capsule  0  . amphetamine-dextroamphetamine (ADDERALL XR) 20 MG 24 hr capsule Take 1 capsule (20 mg total) by mouth every morning.  90 capsule  0  .  amphetamine-dextroamphetamine (ADDERALL XR, 20MG ,) 20 MG 24 hr capsule Take 1 capsule (20 mg total) by mouth every morning.  90 capsule  0  . amphetamine-dextroamphetamine (ADDERALL XR, 20MG ,) 20 MG 24 hr capsule Take 1 capsule (20 mg total) by mouth every morning.  90 capsule  3  . amphetamine-dextroamphetamine (ADDERALL, 10MG ,) 10 MG tablet Take 1 tablet (10 mg total) by mouth daily.  90 tablet  0  . cyclobenzaprine (FLEXERIL) 10 MG tablet Take 10 mg by mouth 3 (three) times daily as needed.      . desvenlafaxine (PRISTIQ) 50 MG 24 hr tablet Take 1 tablet (50 mg total) by mouth daily.  90 tablet  2  . fexofenadine-pseudoephedrine (ALLEGRA-D) 60-120 MG per tablet Take 1 tablet by mouth daily.      . fluconazole (DIFLUCAN) 150 MG tablet 1 today and repeat in 5 days  2 tablet  0  . mometasone (NASONEX) 50 MCG/ACT nasal spray Place 2 sprays into the nose daily.  17 g  11  . PRISTIQ 50 MG 24 hr tablet TAKE 1 TABLET EVERY DAY  90 tablet  2  . valACYclovir (VALTREX) 500 MG tablet Take 1 tablet (500 mg total) by mouth 2 (two) times daily.  10 tablet  3  . valACYclovir (VALTREX) 500 MG tablet TAKE 1 TABLET TWICE A DAY FOR 5 DAYS  10 tablet  0  . zolpidem (AMBIEN) 10 MG tablet Take 10  mg by mouth at bedtime as needed.        There were no vitals taken for this visit.chart Past Medical History  Diagnosis Date  . Allergy   . Depression   . History of mononucleosis   . Chronic sinusitis     History   Social History  . Marital Status: Married    Spouse Name: N/A    Number of Children: N/A  . Years of Education: N/A   Occupational History  . Not on file.   Social History Main Topics  . Smoking status: Never Smoker   . Smokeless tobacco: Never Used  . Alcohol Use: Yes  . Drug Use: No  . Sexually Active: Not on file   Other Topics Concern  . Not on file   Social History Narrative  . No narrative on file    Past Surgical History  Procedure Date  . Cesarean section     No family  history on file.  Allergies  Allergen Reactions  . Sulfamethoxazole W/Trimethoprim     REACTION: hives    Current Outpatient Prescriptions on File Prior to Visit  Medication Sig Dispense Refill  . amphetamine-dextroamphetamine (ADDERALL, 10MG ,) 10 MG tablet Take 1 tablet (10 mg total) by mouth daily.  90 tablet  0  . cyclobenzaprine (FLEXERIL) 10 MG tablet Take 10 mg by mouth 3 (three) times daily as needed.      . desvenlafaxine (PRISTIQ) 50 MG 24 hr tablet Take 1 tablet (50 mg total) by mouth daily.  90 tablet  2  . fexofenadine-pseudoephedrine (ALLEGRA-D) 60-120 MG per tablet Take 1 tablet by mouth daily.      . mometasone (NASONEX) 50 MCG/ACT nasal spray Place 2 sprays into the nose daily.  17 g  11  . PRISTIQ 50 MG 24 hr tablet TAKE 1 TABLET EVERY DAY  90 tablet  2  . valACYclovir (VALTREX) 500 MG tablet Take 1 tablet (500 mg total) by mouth 2 (two) times daily.  10 tablet  3  . valACYclovir (VALTREX) 500 MG tablet TAKE 1 TABLET TWICE A DAY FOR 5 DAYS  10 tablet  0  . zolpidem (AMBIEN) 10 MG tablet Take 10 mg by mouth at bedtime as needed.      Marland Kitchen amphetamine-dextroamphetamine (ADDERALL XR) 20 MG 24 hr capsule Take 1 capsule (20 mg total) by mouth every morning.  90 capsule  0  . amphetamine-dextroamphetamine (ADDERALL XR) 20 MG 24 hr capsule Take 1 capsule (20 mg total) by mouth every morning.  90 capsule  0  . amphetamine-dextroamphetamine (ADDERALL XR, 20MG ,) 20 MG 24 hr capsule Take 1 capsule (20 mg total) by mouth every morning.  90 capsule  0  . amphetamine-dextroamphetamine (ADDERALL XR, 20MG ,) 20 MG 24 hr capsule Take 1 capsule (20 mg total) by mouth every morning.  90 capsule  3  . fluconazole (DIFLUCAN) 150 MG tablet 1 today and repeat in 5 days  2 tablet  0    BP 100/80  Pulse 115  Temp(Src) 99 F (37.2 C) (Oral)  SpO2 98%chart    Objective:   Physical Exam  Constitutional: She is oriented to person, place, and time. She appears well-developed and well-nourished. No  distress.  Cardiovascular: Normal rate, regular rhythm, normal heart sounds and intact distal pulses.  Exam reveals no gallop and no friction rub.   No murmur heard. Pulmonary/Chest: Effort normal and breath sounds normal. No respiratory distress. She has no wheezes. She has no rales. She exhibits no  tenderness.  Abdominal: Soft. Bowel sounds are normal. She exhibits no distension and no mass. There is no tenderness. There is no rebound and no guarding.  Neurological: She is alert and oriented to person, place, and time.  Skin: Skin is warm and dry. She is not diaphoretic.          Assessment & Plan:  Assessment: Urine frequency Plan: Urinalysis, urine c and s, cipro, increase po fluid intake (water and cranberry juice), teaching handout on diagnosis and treatment provided, encouraged to RTC if s/s get worse, Opportunity for questions provided.

## 2012-01-08 NOTE — Patient Instructions (Signed)

## 2012-01-23 ENCOUNTER — Telehealth: Payer: Self-pay | Admitting: *Deleted

## 2012-01-23 NOTE — Telephone Encounter (Signed)
Pt wants to know if Dr. Lovell Sheehan will give her a Zpack for a sinus infection. She has pressure behind eyes and congestion.

## 2012-01-23 NOTE — Telephone Encounter (Signed)
Per dr Franciso Bend- may have z pack

## 2012-03-01 ENCOUNTER — Telehealth: Payer: Self-pay | Admitting: Internal Medicine

## 2012-03-01 NOTE — Telephone Encounter (Signed)
Pt needs new rxs adderall xr 20 mg #90 and generic adderall 10mg  #90. Pt will be out of med tomorrow.

## 2012-03-02 ENCOUNTER — Other Ambulatory Visit: Payer: Self-pay | Admitting: *Deleted

## 2012-03-02 DIAGNOSIS — F988 Other specified behavioral and emotional disorders with onset usually occurring in childhood and adolescence: Secondary | ICD-10-CM

## 2012-03-02 MED ORDER — AMPHETAMINE-DEXTROAMPHET ER 20 MG PO CP24: 20.0000 mg | ORAL_CAPSULE | ORAL | Status: DC

## 2012-03-02 MED ORDER — AMPHETAMINE-DEXTROAMPHETAMINE 10 MG PO TABS: 10.0000 mg | ORAL_TABLET | Freq: Every day | ORAL | Status: DC

## 2012-03-02 NOTE — Telephone Encounter (Signed)
printed and will call pt  When ready for pick up

## 2012-03-03 ENCOUNTER — Other Ambulatory Visit: Payer: Self-pay | Admitting: *Deleted

## 2012-03-03 MED ORDER — ZOLPIDEM TARTRATE 10 MG PO TABS: 10.0000 mg | ORAL_TABLET | Freq: Every evening | ORAL | Status: DC | PRN

## 2012-06-02 ENCOUNTER — Telehealth: Payer: Self-pay | Admitting: Family Medicine

## 2012-06-02 DIAGNOSIS — F988 Other specified behavioral and emotional disorders with onset usually occurring in childhood and adolescence: Secondary | ICD-10-CM

## 2012-06-02 MED ORDER — AMPHETAMINE-DEXTROAMPHETAMINE 10 MG PO TABS: 10.0000 mg | ORAL_TABLET | Freq: Every day | ORAL | Status: DC

## 2012-06-02 MED ORDER — AMPHETAMINE-DEXTROAMPHET ER 20 MG PO CP24: 20.0000 mg | ORAL_CAPSULE | ORAL | Status: DC

## 2012-06-02 NOTE — Telephone Encounter (Signed)
Pt would like rx for Adderal XR 20mg  (90 pills due to insurance costs), and for Adderall 10mg  (90 pills). Please call when ready for pick up.

## 2012-06-02 NOTE — Telephone Encounter (Signed)
Printed and will call pt and let her know when it has been signed and can pack up

## 2012-06-09 ENCOUNTER — Telehealth: Payer: Self-pay | Admitting: Internal Medicine

## 2012-06-09 NOTE — Telephone Encounter (Signed)
Caller: [name]/Patient; Patient Name: Kristi Hill; PCP: Darryll Capers (Adults only); Best Callback Phone Number: [phone];  Calling regarding sinus pain and congestion that started 06/07/12. Afebrile. Yellow nasal discharge, tried Mucinex D and Nyquil for sinuses and no relief. She is sure this is a sinus infection. Emergent signs and symptoms ruled out as per Upper Respiratory Infection protocol except for see in 72 hours due to generalized mild frontal headache unresponsive to home care. LMP 05/23/12.  Appointment scheduled for 06/10/12 at 3:00 pm with Dr. Selena Batten. Call back parameters given.

## 2012-06-10 ENCOUNTER — Ambulatory Visit: Payer: BC Managed Care – PPO | Admitting: Family Medicine

## 2012-06-10 ENCOUNTER — Other Ambulatory Visit: Payer: Self-pay | Admitting: Internal Medicine

## 2012-06-15 ENCOUNTER — Ambulatory Visit (INDEPENDENT_AMBULATORY_CARE_PROVIDER_SITE_OTHER): Payer: BC Managed Care – PPO | Admitting: Internal Medicine

## 2012-06-15 ENCOUNTER — Encounter: Payer: Self-pay | Admitting: Internal Medicine

## 2012-06-15 VITALS — BP 110/80 | Temp 98.2°F

## 2012-06-15 DIAGNOSIS — J309 Allergic rhinitis, unspecified: Secondary | ICD-10-CM

## 2012-06-15 DIAGNOSIS — J019 Acute sinusitis, unspecified: Secondary | ICD-10-CM

## 2012-06-15 MED ORDER — AMOXICILLIN-POT CLAVULANATE 875-125 MG PO TABS: 1.0000 | ORAL_TABLET | Freq: Two times a day (BID) | ORAL | Status: AC

## 2012-06-15 NOTE — Patient Instructions (Signed)
    Use saline irrigation, warm  moist compresses and over-the-counter decongestants only as directed.  Call if there is no improvement in 5 to 7 days, or sooner if you develop increasing pain, fever, or any new symptoms.  Take your antibiotic as prescribed until ALL of it is gone, but stop if you develop a rash, swelling, or any side effects of the medication.  Contact our office as soon as possible if  there are side effects of the medication. 

## 2012-06-15 NOTE — Progress Notes (Signed)
Subjective:    Patient ID: Kristi Hill, female    DOB: 03-19-1971, 41 y.o.   MRN: [id number]  HPI  41 year old patient who has a history of allergic rhinitis. She's had bouts of acute sinusitis over the years. She has done poorly for the past 2 weeks with sinus congestion drainage and ear pain. She seemed to improve last week but over the past 3-4 days has relapsed with increasing sinus pain pressure and purulent drainage she has some associated headaches. No documented fever. She has been using Allegra-D and Nasonex.  Past Medical History  Diagnosis Date  . Allergy   . Depression   . History of mononucleosis   . Chronic sinusitis     History   Social History  . Marital Status: Married    Spouse Name: N/A    Number of Children: N/A  . Years of Education: N/A   Occupational History  . Not on file.   Social History Main Topics  . Smoking status: Never Smoker   . Smokeless tobacco: Never Used  . Alcohol Use: Yes  . Drug Use: No  . Sexually Active: Not on file   Other Topics Concern  . Not on file   Social History Narrative  . No narrative on file    Past Surgical History  Procedure Date  . Cesarean section     No family history on file.  Allergies  Allergen Reactions  . Sulfamethoxazole W-Trimethoprim     REACTION: hives    Current Outpatient Prescriptions on File Prior to Visit  Medication Sig Dispense Refill  . amphetamine-dextroamphetamine (ADDERALL XR) 20 MG 24 hr capsule Take 1 capsule (20 mg total) by mouth every morning.  90 capsule  0  . amphetamine-dextroamphetamine (ADDERALL) 10 MG tablet Take 1 tablet (10 mg total) by mouth daily.  90 tablet  0  . cyclobenzaprine (FLEXERIL) 10 MG tablet Take 10 mg by mouth 3 (three) times daily as needed.      . fexofenadine-pseudoephedrine (ALLEGRA-D) 60-120 MG per tablet Take 1 tablet by mouth daily.      . fluconazole (DIFLUCAN) 150 MG tablet 1 today and repeat in 5 days  2 tablet  0  . mometasone (NASONEX)  50 MCG/ACT nasal spray Place 2 sprays into the nose daily.  17 g  11  . PRISTIQ 50 MG 24 hr tablet TAKE 1 TABLET EVERY DAY  90 tablet  2  . zolpidem (AMBIEN) 10 MG tablet Take 1 tablet (10 mg total) by mouth at bedtime as needed.  30 tablet  3  . valACYclovir (VALTREX) 500 MG tablet Take 1 tablet (500 mg total) by mouth 2 (two) times daily.  10 tablet  3    BP 110/80  Temp 98.2 F (36.8 C) (Oral)       Review of Systems  Constitutional: Positive for fatigue.  HENT: Positive for congestion, sore throat and sinus pressure. Negative for hearing loss, rhinorrhea, dental problem and tinnitus.   Eyes: Negative for pain, discharge and visual disturbance.  Respiratory: Negative for cough and shortness of breath.   Cardiovascular: Negative for chest pain, palpitations and leg swelling.  Gastrointestinal: Negative for nausea, vomiting, abdominal pain, diarrhea, constipation, blood in stool and abdominal distention.  Genitourinary: Negative for dysuria, urgency, frequency, hematuria, flank pain, vaginal bleeding, vaginal discharge, difficulty urinating, vaginal pain and pelvic pain.  Musculoskeletal: Negative for joint swelling, arthralgias and gait problem.  Skin: Negative for rash.  Neurological: Negative for dizziness, syncope, speech difficulty, weakness,  numbness and headaches.  Hematological: Negative for adenopathy.  Psychiatric/Behavioral: Negative for behavioral problems, dysphoric mood and agitation. The patient is not nervous/anxious.        Objective:   Physical Exam  Constitutional: She appears well-developed and well-nourished.  HENT:  Head: Normocephalic.  Right Ear: External ear normal.  Left Ear: External ear normal.  Mouth/Throat: Oropharynx is clear and moist.       Mild maxillary sinus tenderness  Eyes: Conjunctivae normal and EOM are normal. Pupils are equal, round, and reactive to light.  Neck: Normal range of motion. Neck supple. No thyromegaly present.    Cardiovascular: Normal rate, regular rhythm, normal heart sounds and intact distal pulses.   Pulmonary/Chest: Effort normal and breath sounds normal.  Abdominal: She exhibits no mass. There is no tenderness.  Lymphadenopathy:    She has no cervical adenopathy.  Skin: No rash noted.  Psychiatric: She has a normal mood and affect. Her behavior is normal.          Assessment & Plan:   Acute sinusitis. We'll continue decongestants. Will treat with antibiotic therapy expectorants. Will call if unimproved  Allergic rhinitis. Will continue Nasonex

## 2012-07-24 ENCOUNTER — Other Ambulatory Visit: Payer: Self-pay | Admitting: Internal Medicine

## 2012-09-05 ENCOUNTER — Other Ambulatory Visit: Payer: Self-pay | Admitting: Internal Medicine

## 2012-09-10 ENCOUNTER — Other Ambulatory Visit: Payer: Self-pay | Admitting: Internal Medicine

## 2012-09-10 DIAGNOSIS — F988 Other specified behavioral and emotional disorders with onset usually occurring in childhood and adolescence: Secondary | ICD-10-CM

## 2012-09-10 MED ORDER — AMPHETAMINE-DEXTROAMPHETAMINE 10 MG PO TABS: 10.0000 mg | ORAL_TABLET | Freq: Every day | ORAL | Status: DC

## 2012-09-10 MED ORDER — AMPHETAMINE-DEXTROAMPHET ER 20 MG PO CP24: 20.0000 mg | ORAL_CAPSULE | ORAL | Status: DC

## 2012-09-10 NOTE — Telephone Encounter (Signed)
Pt needs new rxs generic adderall 20 mg xr #90 and generic adderall 10 mg #90. Pt would like to know if pristiq comes in generic if so pt would like a written 90 rx. Pt is aware MD out of office

## 2012-09-10 NOTE — Telephone Encounter (Signed)
Can you give her a month supply?

## 2012-09-10 NOTE — Telephone Encounter (Signed)
rx up front ready for p/u, pt aware 

## 2012-09-10 NOTE — Telephone Encounter (Signed)
ok 

## 2012-10-27 ENCOUNTER — Other Ambulatory Visit: Payer: Self-pay | Admitting: Internal Medicine

## 2012-11-01 ENCOUNTER — Other Ambulatory Visit: Payer: Self-pay | Admitting: Internal Medicine

## 2012-11-24 ENCOUNTER — Telehealth: Payer: Self-pay | Admitting: Internal Medicine

## 2012-11-24 NOTE — Telephone Encounter (Signed)
C/o changing i insurance and the insurance denied her pristiq and will now cost her 300.  Can we try to pre authorize-she has [facility] and [facility] .  She has tried welbutrin,cymbalta,celexa,effexor,prozac,and none has worked as well as Dentist.   Dr Haskell Flirt states she had talked with you several years ago when her father passed away about possibly taking xanax prn, but she declined to take at that time.  Now she is under a lot of stress and would like a small dose of xanax to take prn to "get over the hump"  States she is currently seeing a therapist

## 2012-11-24 NOTE — Telephone Encounter (Signed)
Caller: Kristi Hill/Patient; Phone: [phone]; Reason for Call: Pt calling today 11/24/12 regarding pt requesting to talk with Kendal Hymen, Dr.  Lovell Sheehan nurse about a medication that she is taking (Prisitiq).  Please call pt back at [phone].  Thanks.

## 2012-11-29 NOTE — Telephone Encounter (Signed)
Try a prior authorization since she has tried the other

## 2012-11-30 NOTE — Telephone Encounter (Signed)
Spoke with [facility]. Per her insurance, Pristiq does not require prior auth. She must pay full price cash until she meets her deductible - then coverage will kick in. Called pt and LMOM w/that information. Encounter closed.

## 2012-12-01 ENCOUNTER — Other Ambulatory Visit: Payer: Self-pay | Admitting: *Deleted

## 2012-12-14 ENCOUNTER — Telehealth: Payer: Self-pay | Admitting: Internal Medicine

## 2012-12-14 NOTE — Telephone Encounter (Signed)
Patient Information:  Caller Name: Jerline  Phone: [phone]  Patient: Kristi Hill, Kristi Hill  Gender: Female  DOB: 10-09-1970  Age: 42 Years  PCP: Darryll Capers (Adults only)  Pregnant: No  Office Follow Up:  Does the office need to follow up with this patient?: No  Instructions For The Office: N/A   Symptoms  Reason For Call & Symptoms: Pt is calling to see if Dr. Lovell Sheehan would call in an abx. She started with sinus pain /pressure on Friday 12/10/12. She is afeb. 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 Pt refused appt. Insisted request be sent  Reviewed Health History In EMR: Yes  Reviewed Medications In EMR: Yes  Reviewed Allergies In EMR: Yes  Reviewed Surgeries / Procedures: Yes  Date of Onset of Symptoms: 12/10/2012 OB / GYN:  LMP: 11/19/2012  Guideline(s) Used:  Sinus Pain and Congestion  Disposition Per Guideline:   See Today in Office  Reason For Disposition Reached:   Sinus pain (not just congestion) and fever  Advice Given:  N/A  Patient Refused Recommendation:  Patient Requests Prescription  [facility]

## 2012-12-15 MED ORDER — AZITHROMYCIN 250 MG PO TABS: ORAL_TABLET | ORAL | Status: DC

## 2012-12-15 NOTE — Telephone Encounter (Signed)
Z pak sent to pharmacy. Left a message making aware rx sent.

## 2012-12-15 NOTE — Telephone Encounter (Signed)
Pls advise.  

## 2012-12-15 NOTE — Telephone Encounter (Signed)
Per dr jenkins-may have z pack 

## 2013-02-08 ENCOUNTER — Telehealth: Payer: Self-pay | Admitting: Internal Medicine

## 2013-02-08 ENCOUNTER — Other Ambulatory Visit: Payer: Self-pay | Admitting: Internal Medicine

## 2013-02-08 DIAGNOSIS — F988 Other specified behavioral and emotional disorders with onset usually occurring in childhood and adolescence: Secondary | ICD-10-CM

## 2013-02-08 MED ORDER — AMPHETAMINE-DEXTROAMPHET ER 20 MG PO CP24: 20.0000 mg | ORAL_CAPSULE | ORAL | Status: DC

## 2013-02-08 MED ORDER — AMPHETAMINE-DEXTROAMPHETAMINE 10 MG PO TABS
10.0000 mg | ORAL_TABLET | Freq: Every day | ORAL | Status: DC
Start: 2013-02-08 — End: 2013-05-19

## 2013-02-08 NOTE — Telephone Encounter (Signed)
Pt calling to request a refill of her amphetamine-dextroamphetamine (ADDERALL XR) 20 MG 24 hr capsule, AND amphetamine-dextroamphetamine (ADDERALL) 10 MG tablet. Please assist.

## 2013-02-08 NOTE — Telephone Encounter (Signed)
Scripts printed and pt will callled for pt to pick up wh enready

## 2013-02-08 NOTE — Telephone Encounter (Signed)
Scripts printed and pt will be called to pick up when dr Lovell Sheehan

## 2013-02-10 ENCOUNTER — Other Ambulatory Visit: Payer: Self-pay | Admitting: Internal Medicine

## 2013-02-16 ENCOUNTER — Telehealth: Payer: Self-pay | Admitting: Internal Medicine

## 2013-02-16 MED ORDER — AZITHROMYCIN 250 MG PO TABS: ORAL_TABLET | ORAL | Status: DC

## 2013-02-16 NOTE — Telephone Encounter (Signed)
Confidential Office Message [address] [place], [place] [address] p. [phone] f. [phone] To: [facility] (After Hours Triage) Fax: [phone] From: [facility] Date/ Time: 02/15/2013 4:43 PM Taken By: Jethro BolusDoreen Salvage Facility: home Patient: Eulah Pont DOB: 10-Jul-1971 Phone: [phone] Reason for Call: Message is for Kendal Hymen, Dr Lovell Sheehan' RN. I am having a sinus infection. Could a z-pack be called in? I was told there are no appts for Dr Lovell Sheehan this week. She is in a class all week.

## 2013-02-16 NOTE — Telephone Encounter (Signed)
May have z pack  Per dr Lovell Sheehan- ,Left message on machine For pt adn [facility] [address]

## 2013-03-10 ENCOUNTER — Other Ambulatory Visit: Payer: Self-pay | Admitting: Internal Medicine

## 2013-05-04 ENCOUNTER — Other Ambulatory Visit: Payer: Self-pay | Admitting: Internal Medicine

## 2013-05-16 ENCOUNTER — Other Ambulatory Visit: Payer: Self-pay | Admitting: Internal Medicine

## 2013-05-19 ENCOUNTER — Telehealth: Payer: Self-pay | Admitting: Internal Medicine

## 2013-05-19 DIAGNOSIS — F988 Other specified behavioral and emotional disorders with onset usually occurring in childhood and adolescence: Secondary | ICD-10-CM

## 2013-05-19 MED ORDER — AMPHETAMINE-DEXTROAMPHETAMINE 10 MG PO TABS: 10.0000 mg | ORAL_TABLET | Freq: Every day | ORAL | Status: DC

## 2013-05-19 MED ORDER — AMPHETAMINE-DEXTROAMPHET ER 20 MG PO CP24: 20.0000 mg | ORAL_CAPSULE | ORAL | Status: DC

## 2013-05-19 NOTE — Telephone Encounter (Signed)
Pt needs new rxs generic adderall xr 20 mg #90 and generic adderall 10 mg #90.

## 2013-05-19 NOTE — Telephone Encounter (Signed)
Printed and Left message on machine May pick up signed scripts on Friday after 12noon

## 2013-06-13 ENCOUNTER — Other Ambulatory Visit: Payer: Self-pay | Admitting: Internal Medicine

## 2013-08-12 ENCOUNTER — Telehealth: Payer: Self-pay | Admitting: Internal Medicine

## 2013-08-12 NOTE — Telephone Encounter (Signed)
Left message on machine No ov since september of 2013>1 year.  Informed pt she needs ov before refill.  Can see padonda next week

## 2013-08-12 NOTE — Telephone Encounter (Signed)
Pt needs new  rxs generic adderall 20 xr #90 and generic adderall 10 mg #90.

## 2013-08-17 ENCOUNTER — Encounter: Payer: Self-pay | Admitting: Internal Medicine

## 2013-08-17 ENCOUNTER — Encounter: Payer: Self-pay | Admitting: Family

## 2013-08-17 ENCOUNTER — Ambulatory Visit (INDEPENDENT_AMBULATORY_CARE_PROVIDER_SITE_OTHER): Payer: Managed Care, Other (non HMO) | Admitting: Family

## 2013-08-17 VITALS — BP 120/70 | HR 64 | Wt 204.0 lb

## 2013-08-17 DIAGNOSIS — F411 Generalized anxiety disorder: Secondary | ICD-10-CM

## 2013-08-17 DIAGNOSIS — F988 Other specified behavioral and emotional disorders with onset usually occurring in childhood and adolescence: Secondary | ICD-10-CM

## 2013-08-17 DIAGNOSIS — F329 Major depressive disorder, single episode, unspecified: Secondary | ICD-10-CM

## 2013-08-17 LAB — T3, FREE: T3, Free: 2.7 pg/mL (ref 2.3–4.2)

## 2013-08-17 MED ORDER — AMPHETAMINE-DEXTROAMPHET ER 20 MG PO CP24: 20.0000 mg | ORAL_CAPSULE | ORAL | Status: DC

## 2013-08-17 MED ORDER — CLONAZEPAM 0.5 MG PO TABS: 0.5000 mg | ORAL_TABLET | Freq: Two times a day (BID) | ORAL | Status: DC | PRN

## 2013-08-17 MED ORDER — AMPHETAMINE-DEXTROAMPHETAMINE 10 MG PO TABS: 10.0000 mg | ORAL_TABLET | Freq: Every day | ORAL | Status: DC

## 2013-08-17 NOTE — Progress Notes (Signed)
Pre visit review using our clinic review tool, if applicable. No additional management support is needed unless otherwise documented below in the visit note. 

## 2013-08-17 NOTE — Patient Instructions (Signed)

## 2013-08-22 ENCOUNTER — Encounter: Payer: Self-pay | Admitting: Family

## 2013-08-22 ENCOUNTER — Other Ambulatory Visit: Payer: Self-pay | Admitting: Family

## 2013-08-22 MED ORDER — DESVENLAFAXINE SUCCINATE ER 100 MG PO TB24: 100.0000 mg | ORAL_TABLET | Freq: Every day | ORAL | Status: DC

## 2013-08-22 NOTE — Progress Notes (Signed)
Subjective:    Patient ID: Kristi Hill, female    DOB: 07/29/1971, 42 y.o.   MRN: [id number]  HPI  42 year old white female, nonsmoker, patient of Dr. Lovell Sheehan is in today for medication followup. She has a history of attention deficit disorder, anxiety, depression. Patient reports feeling more down and depressed recently. Reports her son committing suicide approximately 5 months ago. Since that time she's been having a difficult time coping. In the past she's been on Prozac, Wellbutrin, and Celexa. She's currently on Pristiq and would like to consider increasing the dosage. She would also like her thyroid checked today. Has not had a complete physical in a couple years. Denies any feelings of helplessness hopelessness, no thoughts of death or dying.  Review of Systems  Constitutional: Negative.   HENT: Negative.   Respiratory: Negative.   Cardiovascular: Negative.   Gastrointestinal: Negative.   Endocrine: Negative.   Genitourinary: Negative.   Musculoskeletal: Negative.   Neurological: Negative.   Hematological: Negative.   Psychiatric/Behavioral: The patient is nervous/anxious.    Past Medical History  Diagnosis Date  . Allergy   . Depression   . History of mononucleosis   . Chronic sinusitis     History   Social History  . Marital Status: Married    Spouse Name: N/A    Number of Children: N/A  . Years of Education: N/A   Occupational History  . Not on file.   Social History Main Topics  . Smoking status: Never Smoker   . Smokeless tobacco: Never Used  . Alcohol Use: Yes  . Drug Use: No  . Sexual Activity: Not on file   Other Topics Concern  . Not on file   Social History Narrative  . No narrative on file    Past Surgical History  Procedure Laterality Date  . Cesarean section      No family history on file.  Allergies  Allergen Reactions  . Sulfamethoxazole-Trimethoprim     REACTION: hives    Current Outpatient Prescriptions on File Prior to  Visit  Medication Sig Dispense Refill  . cyclobenzaprine (FLEXERIL) 10 MG tablet Take 10 mg by mouth 3 (three) times daily as needed.      . fexofenadine-pseudoephedrine (ALLEGRA-D) 60-120 MG per tablet Take 1 tablet by mouth daily.      . mometasone (NASONEX) 50 MCG/ACT nasal spray Place 2 sprays into the nose daily.  17 g  11  . PRISTIQ 50 MG 24 hr tablet TAKE 1 TABLET BY MOUTH EVERY DAY  90 tablet  2  . valACYclovir (VALTREX) 500 MG tablet Take 1 tablet (500 mg total) by mouth 2 (two) times daily.  10 tablet  3  . zolpidem (AMBIEN) 10 MG tablet TAKE 1 TABLET AT BEDTIME  30 tablet  2  . azithromycin (ZITHROMAX) 250 MG tablet Take 2 today and then 1 daily until completed  6 tablet  0  . fluconazole (DIFLUCAN) 150 MG tablet 1 today and repeat in 5 days  2 tablet  0  . valACYclovir (VALTREX) 500 MG tablet TAKE 1 TABLET TWICE A DAY FOR 5 DAYS  10 tablet  0  . valACYclovir (VALTREX) 500 MG tablet TAKE 1 TABLET TWICE A DAY FOR 5 DAYS  10 tablet  0   No current facility-administered medications on file prior to visit.    BP 120/70  Pulse 64  Wt 204 lb (92.534 kg)chart    Objective:   Physical Exam  Constitutional: She is oriented  to person, place, and time. She appears well-developed and well-nourished.  HENT:  Right Ear: External ear normal.  Left Ear: External ear normal.  Nose: Nose normal.  Mouth/Throat: Oropharynx is clear and moist.  Neck: Normal range of motion. Neck supple. No thyromegaly present.  Cardiovascular: Normal rate, regular rhythm and normal heart sounds.   Pulmonary/Chest: Effort normal and breath sounds normal.  Abdominal: Soft. Bowel sounds are normal.  Musculoskeletal: Normal range of motion.  Neurological: She is alert and oriented to person, place, and time. She has normal reflexes. She displays normal reflexes. No cranial nerve deficit. Coordination normal.  Skin: Skin is warm and dry.  Psychiatric: She has a normal mood and affect.          Assessment &  Plan:  Assessment: 1. Depression 2. Anxiety 3. Attention deficit disorder  Plan Lab sent to include TSH, T3, T4 will notify patient of the results. Consider increasing Pristiq. Encourage healthy diet and exercise daily. Followup pending labs, in 4 weeks for complete physical exam and sooner as needed.

## 2013-09-08 ENCOUNTER — Other Ambulatory Visit: Payer: Self-pay | Admitting: Internal Medicine

## 2013-11-10 ENCOUNTER — Other Ambulatory Visit: Payer: Self-pay | Admitting: Internal Medicine

## 2013-11-16 ENCOUNTER — Telehealth: Payer: Self-pay | Admitting: Internal Medicine

## 2013-11-16 ENCOUNTER — Other Ambulatory Visit: Payer: Self-pay | Admitting: *Deleted

## 2013-11-16 DIAGNOSIS — F988 Other specified behavioral and emotional disorders with onset usually occurring in childhood and adolescence: Secondary | ICD-10-CM

## 2013-11-16 MED ORDER — AMPHETAMINE-DEXTROAMPHET ER 20 MG PO CP24: 20.0000 mg | ORAL_CAPSULE | ORAL | Status: DC

## 2013-11-16 MED ORDER — AMPHETAMINE-DEXTROAMPHETAMINE 10 MG PO TABS: 10.0000 mg | ORAL_TABLET | Freq: Every day | ORAL | Status: DC

## 2013-11-16 NOTE — Telephone Encounter (Signed)
Needs office visit. Has not been seen since 11/26

## 2013-11-16 NOTE — Telephone Encounter (Signed)
Pt informed, ready for pick up

## 2013-11-16 NOTE — Telephone Encounter (Signed)
Couldnt find documentation in chart of last ov to increase except to increase pristiq. Please advise

## 2013-11-16 NOTE — Telephone Encounter (Signed)
Pt is now wanting to get the original rx amphetamine-dextroamphetamine (ADDERALL XR) 20 MG 24 hr capsule and adderall 10 mg, states she had previously requested for an increase however she will be leaving to go out of town on Friday and needs her meds before leaving. Pt states she will talk with dr. Arnoldo Morale when she comes in for her physical regarding the increase.  90 day supply

## 2013-11-16 NOTE — Telephone Encounter (Signed)
Pt needs new rx generic adderall xr 20 mg and pt stated NP said she would increase generic adderall 10 mg to 20 mg.

## 2013-11-16 NOTE — Telephone Encounter (Signed)
lmom for pt to make appt. °

## 2013-11-16 NOTE — Telephone Encounter (Signed)
Left message on machine.

## 2014-01-06 ENCOUNTER — Other Ambulatory Visit (INDEPENDENT_AMBULATORY_CARE_PROVIDER_SITE_OTHER): Payer: Managed Care, Other (non HMO)

## 2014-01-06 DIAGNOSIS — Z0000 Encounter for general adult medical examination without abnormal findings: Secondary | ICD-10-CM

## 2014-01-06 LAB — POCT URINALYSIS DIPSTICK
Bilirubin, UA: NEGATIVE
Blood, UA: NEGATIVE
Glucose, UA: NEGATIVE
Ketones, UA: NEGATIVE
Nitrite, UA: NEGATIVE
PH UA: 7.5
Spec Grav, UA: 1.015
Urobilinogen, UA: 1

## 2014-01-06 LAB — CBC WITH DIFFERENTIAL/PLATELET
Basophils Absolute: 0 10*3/uL (ref 0.0–0.1)
Basophils Relative: 0.6 % (ref 0.0–3.0)
EOS PCT: 1.4 % (ref 0.0–5.0)
Eosinophils Absolute: 0.1 10*3/uL (ref 0.0–0.7)
HCT: 40.4 % (ref 36.0–46.0)
Hemoglobin: 13.6 g/dL (ref 12.0–15.0)
Lymphocytes Relative: 37.3 % (ref 12.0–46.0)
Lymphs Abs: 1.7 10*3/uL (ref 0.7–4.0)
MCHC: 33.5 g/dL (ref 30.0–36.0)
MCV: 95.4 fl (ref 78.0–100.0)
MONOS PCT: 12.1 % — AB (ref 3.0–12.0)
Monocytes Absolute: 0.6 10*3/uL (ref 0.1–1.0)
NEUTROS PCT: 48.6 % (ref 43.0–77.0)
Neutro Abs: 2.2 10*3/uL (ref 1.4–7.7)
PLATELETS: 240 10*3/uL (ref 150.0–400.0)
RBC: 4.24 Mil/uL (ref 3.87–5.11)
RDW: 12.9 % (ref 11.5–14.6)
WBC: 4.6 10*3/uL (ref 4.5–10.5)

## 2014-01-06 LAB — HEPATIC FUNCTION PANEL
ALK PHOS: 40 U/L (ref 39–117)
ALT: 12 U/L (ref 0–35)
AST: 22 U/L (ref 0–37)
Albumin: 3.8 g/dL (ref 3.5–5.2)
BILIRUBIN DIRECT: 0.1 mg/dL (ref 0.0–0.3)
BILIRUBIN TOTAL: 0.7 mg/dL (ref 0.3–1.2)
TOTAL PROTEIN: 6.8 g/dL (ref 6.0–8.3)

## 2014-01-06 LAB — LIPID PANEL
Cholesterol: 123 mg/dL (ref 0–200)
HDL: 55.7 mg/dL (ref >39.00)
LDL Cholesterol: 59 mg/dL (ref 0–99)
Total CHOL/HDL Ratio: 2
Triglycerides: 43 mg/dL (ref 0.0–149.0)
VLDL: 8.6 mg/dL (ref 0.0–40.0)

## 2014-01-06 LAB — BASIC METABOLIC PANEL
BUN: 23 mg/dL (ref 6–23)
CHLORIDE: 108 meq/L (ref 96–112)
CO2: 23 mEq/L (ref 19–32)
CREATININE: 0.8 mg/dL (ref 0.4–1.2)
Calcium: 8.9 mg/dL (ref 8.4–10.5)
GFR: 87.83 mL/min (ref >60.00)
Glucose, Bld: 82 mg/dL (ref 70–99)
POTASSIUM: 3.9 meq/L (ref 3.5–5.1)
Sodium: 138 mEq/L (ref 135–145)

## 2014-01-06 LAB — TSH: TSH: 0.56 u[IU]/mL (ref 0.35–5.50)

## 2014-01-10 ENCOUNTER — Other Ambulatory Visit: Payer: Self-pay | Admitting: Internal Medicine

## 2014-01-13 ENCOUNTER — Ambulatory Visit (INDEPENDENT_AMBULATORY_CARE_PROVIDER_SITE_OTHER): Payer: Managed Care, Other (non HMO) | Admitting: Internal Medicine

## 2014-01-13 ENCOUNTER — Encounter: Payer: Self-pay | Admitting: *Deleted

## 2014-01-13 ENCOUNTER — Encounter: Payer: Self-pay | Admitting: Internal Medicine

## 2014-01-13 VITALS — BP 122/82 | HR 64 | Temp 98.1°F | Ht 68.25 in | Wt 218.0 lb

## 2014-01-13 DIAGNOSIS — F988 Other specified behavioral and emotional disorders with onset usually occurring in childhood and adolescence: Secondary | ICD-10-CM

## 2014-01-13 DIAGNOSIS — Z0000 Encounter for general adult medical examination without abnormal findings: Secondary | ICD-10-CM

## 2014-01-13 MED ORDER — AMPHETAMINE-DEXTROAMPHETAMINE 10 MG PO TABS: 10.0000 mg | ORAL_TABLET | Freq: Every day | ORAL | Status: DC

## 2014-01-13 MED ORDER — AMPHETAMINE-DEXTROAMPHET ER 25 MG PO CP24: 25.0000 mg | ORAL_CAPSULE | ORAL | Status: DC

## 2014-01-13 NOTE — Progress Notes (Signed)
Pre visit review using our clinic review tool, if applicable. No additional management support is needed unless otherwise documented below in the visit note. 

## 2014-01-13 NOTE — Progress Notes (Signed)
Subjective:    Patient ID: Kristi Hill, female    DOB: [birth year], 43 y.o.   MRN: [id number]  HPI CPX Reviewed excelent labs Lost step son and has had grief  Increased the pristiq to 100 and backed off to the 50  Review of Systems  Constitutional: Negative for activity change, appetite change and fatigue.  HENT: Negative for congestion, ear pain, postnasal drip and sinus pressure.   Eyes: Negative for redness and visual disturbance.  Respiratory: Negative for cough, shortness of breath and wheezing.   Gastrointestinal: Negative for abdominal pain and abdominal distention.  Genitourinary: Negative for dysuria, frequency and menstrual problem.  Musculoskeletal: Negative for arthralgias, joint swelling, myalgias and neck pain.  Skin: Negative for rash and wound.  Neurological: Negative for dizziness, weakness and headaches.  Hematological: Negative for adenopathy. Does not bruise/bleed easily.  Psychiatric/Behavioral: Negative for sleep disturbance and decreased concentration.   Past Medical History  Diagnosis Date  . Allergy   . Depression   . History of mononucleosis   . Chronic sinusitis     History   Social History  . Marital Status: Married    Spouse Name: N/A    Number of Children: N/A  . Years of Education: N/A   Occupational History  . Not on file.   Social History Main Topics  . Smoking status: Never Smoker   . Smokeless tobacco: Never Used  . Alcohol Use: Yes  . Drug Use: No  . Sexual Activity: Not on file   Other Topics Concern  . Not on file   Social History Narrative  . No narrative on file    Past Surgical History  Procedure Laterality Date  . Cesarean section      No family history on file.  Allergies  Allergen Reactions  . Sulfamethoxazole-Trimethoprim     REACTION: hives    Current Outpatient Prescriptions on File Prior to Visit  Medication Sig Dispense Refill  . amphetamine-dextroamphetamine (ADDERALL XR) 20 MG 24 hr  capsule Take 1 capsule (20 mg total) by mouth every morning.  90 capsule  0  . amphetamine-dextroamphetamine (ADDERALL) 10 MG tablet Take 1 tablet (10 mg total) by mouth daily.  90 tablet  0  . clonazePAM (KLONOPIN) 0.5 MG tablet Take 1 tablet (0.5 mg total) by mouth 2 (two) times daily as needed for anxiety.  30 tablet  1  . desvenlafaxine (PRISTIQ) 100 MG 24 hr tablet Take 1 tablet (100 mg total) by mouth daily.  30 tablet  3  . fexofenadine-pseudoephedrine (ALLEGRA-D) 60-120 MG per tablet Take 1 tablet by mouth daily.      . valACYclovir (VALTREX) 500 MG tablet Take 1 tablet (500 mg total) by mouth 2 (two) times daily.  10 tablet  3  . zolpidem (AMBIEN) 10 MG tablet TAKE 1 TABLET BY MOUTH AT BEDTIME  30 tablet  3  . cyclobenzaprine (FLEXERIL) 10 MG tablet Take 10 mg by mouth 3 (three) times daily as needed.      . mometasone (NASONEX) 50 MCG/ACT nasal spray Place 2 sprays into the nose daily.  17 g  11   No current facility-administered medications on file prior to visit.    BP 122/82  Pulse 64  Temp(Src) 98.1 F (36.7 C) (Oral)  Ht 5' 8.25" (1.734 m)  Wt 218 lb (98.884 kg)  BMI 32.89 kg/m2  LMP 01/13/2014       Objective:   Physical Exam  Constitutional: She is oriented to person, place, and  time. She appears well-developed and well-nourished. No distress.  HENT:  Head: Normocephalic and atraumatic.  Right Ear: External ear normal.  Left Ear: External ear normal.  Nose: Nose normal.  Mouth/Throat: Oropharynx is clear and moist.  Eyes: Conjunctivae and EOM are normal. Pupils are equal, round, and reactive to light.  Neck: Normal range of motion. Neck supple. No JVD present. No tracheal deviation present. No thyromegaly present.  Cardiovascular: Normal rate, regular rhythm, normal heart sounds and intact distal pulses.   No murmur heard. Pulmonary/Chest: Effort normal and breath sounds normal. She has no wheezes. She exhibits no tenderness.  Abdominal: Soft. Bowel sounds are  normal.  Musculoskeletal: Normal range of motion. She exhibits no edema and no tenderness.  Lymphadenopathy:    She has no cervical adenopathy.  Neurological: She is alert and oriented to person, place, and time. She has normal reflexes. No cranial nerve deficit.  Skin: Skin is warm and dry. She is not diaphoretic.  Psychiatric: She has a normal mood and affect. Her behavior is normal.          Assessment & Plan:   This is a routine wellness  examination for this patient . I reviewed all health maintenance protocols including mammography, colonoscopy, bone density Needed referrals were placed. Age and diagnosis  appropriate screening labs were ordered. Her immunization history was reviewed and appropriate vaccinations were ordered. Her current medications and allergies were reviewed and needed refills of her chronic medications were ordered. The plan for yearly health maintenance was discussed all orders and referrals were made as appropriate.  Has failed generic welbutrin and effexor so we can notify insurance of need for medications

## 2014-01-13 NOTE — Patient Instructions (Signed)
Call for refill in 90 days

## 2014-01-22 ENCOUNTER — Other Ambulatory Visit: Payer: Self-pay | Admitting: Internal Medicine

## 2014-02-16 ENCOUNTER — Telehealth: Payer: Self-pay | Admitting: Internal Medicine

## 2014-02-16 NOTE — Telephone Encounter (Signed)
Pt requested a Tier Exception for Pristiq and [facility] denied the request. Pt must try and fail at least 2 formulary alternatives or have a documented clinical reason why she is unable to take the formulary alternatives before being considered for a the lower co-pay.

## 2014-02-21 MED ORDER — VENLAFAXINE HCL ER 150 MG PO CP24: 150.0000 mg | ORAL_CAPSULE | Freq: Every day | ORAL | Status: DC

## 2014-02-21 NOTE — Addendum Note (Signed)
Addended by: Townsend Roger D on: 02/21/2014 11:49 AM   Modules accepted: Orders, Medications

## 2014-02-21 NOTE — Telephone Encounter (Signed)
Per Dr Arnoldo Morale call in Effexor XR 150 mg once daily, rx sent in electronically to [facility]

## 2014-03-16 ENCOUNTER — Encounter: Payer: Self-pay | Admitting: Internal Medicine

## 2014-05-04 ENCOUNTER — Other Ambulatory Visit: Payer: Self-pay | Admitting: Internal Medicine

## 2014-05-17 ENCOUNTER — Telehealth: Payer: Self-pay | Admitting: Internal Medicine

## 2014-05-17 DIAGNOSIS — F988 Other specified behavioral and emotional disorders with onset usually occurring in childhood and adolescence: Secondary | ICD-10-CM

## 2014-05-17 MED ORDER — AMPHETAMINE-DEXTROAMPHET ER 25 MG PO CP24: 25.0000 mg | ORAL_CAPSULE | ORAL | Status: DC

## 2014-05-17 MED ORDER — AMPHETAMINE-DEXTROAMPHETAMINE 10 MG PO TABS: 10.0000 mg | ORAL_TABLET | Freq: Every day | ORAL | Status: DC

## 2014-05-17 NOTE — Telephone Encounter (Signed)
Pt needs new rxs generic adderall xr 25 mg #90 and generic adderall 10 mg #90

## 2014-05-18 ENCOUNTER — Telehealth: Payer: Self-pay

## 2014-05-18 NOTE — Telephone Encounter (Signed)
LVM that rx ready for pick up

## 2014-05-19 ENCOUNTER — Other Ambulatory Visit: Payer: Self-pay | Admitting: Family

## 2014-05-19 NOTE — Telephone Encounter (Signed)
Pt aware, rx ready for p/u

## 2014-05-22 NOTE — Telephone Encounter (Signed)
Jenkins pt. Not note requesting transfer. Last filled 07/2013. Ok to fill?

## 2014-05-26 ENCOUNTER — Other Ambulatory Visit: Payer: Self-pay | Admitting: Internal Medicine

## 2014-06-05 ENCOUNTER — Other Ambulatory Visit: Payer: Self-pay | Admitting: Internal Medicine

## 2014-08-15 ENCOUNTER — Telehealth: Payer: Self-pay | Admitting: Internal Medicine

## 2014-08-15 DIAGNOSIS — F988 Other specified behavioral and emotional disorders with onset usually occurring in childhood and adolescence: Secondary | ICD-10-CM

## 2014-08-15 NOTE — Telephone Encounter (Signed)
Patient needs a  90 day re-fill on amphetamine-dextroamphetamine (ADDERALL XR) 25 MG 24 hr capsule and amphetamine-dextroamphetamine (ADDERALL) 10 MG tablet.  She states her insurance covers 90 day fills. She is scheduled to transfer to [facility] 09/26/14. She said she will be out of medication on Friday 08/18/14.

## 2014-08-22 MED ORDER — AMPHETAMINE-DEXTROAMPHET ER 25 MG PO CP24: 25.0000 mg | ORAL_CAPSULE | ORAL | Status: DC

## 2014-08-22 MED ORDER — AMPHETAMINE-DEXTROAMPHETAMINE 10 MG PO TABS: 10.0000 mg | ORAL_TABLET | Freq: Every day | ORAL | Status: DC

## 2014-08-23 NOTE — Telephone Encounter (Signed)
Pt is aware waiting on dr Arnoldo Morale to sign rxs

## 2014-08-23 NOTE — Telephone Encounter (Signed)
Dr Arnoldo Morale called and said he would come sign rx.  rx printed and waiting for signature

## 2014-08-23 NOTE — Telephone Encounter (Signed)
rx up front for p/u, pt aware

## 2014-09-19 ENCOUNTER — Other Ambulatory Visit: Payer: Self-pay | Admitting: *Deleted

## 2014-09-19 MED ORDER — DESVENLAFAXINE SUCCINATE ER 50 MG PO TB24: 50.0000 mg | ORAL_TABLET | Freq: Every day | ORAL | Status: DC

## 2014-09-26 ENCOUNTER — Ambulatory Visit: Payer: Managed Care, Other (non HMO) | Admitting: Family

## 2014-09-26 ENCOUNTER — Telehealth: Payer: Self-pay | Admitting: Internal Medicine

## 2014-09-26 NOTE — Telephone Encounter (Signed)
No establish appointments available at this time

## 2014-09-26 NOTE — Telephone Encounter (Signed)
Pt need a appt before 11/23/14 because her rx will expire. She was to establish with Padonda . Is there anyway she can be seen before.

## 2014-11-16 ENCOUNTER — Encounter: Payer: Self-pay | Admitting: Family Medicine

## 2014-11-16 ENCOUNTER — Ambulatory Visit (INDEPENDENT_AMBULATORY_CARE_PROVIDER_SITE_OTHER): Payer: BLUE CROSS/BLUE SHIELD | Admitting: Family Medicine

## 2014-11-16 VITALS — BP 122/82 | HR 104 | Temp 98.6°F | Ht 68.25 in | Wt 216.7 lb

## 2014-11-16 DIAGNOSIS — F988 Other specified behavioral and emotional disorders with onset usually occurring in childhood and adolescence: Secondary | ICD-10-CM

## 2014-11-16 DIAGNOSIS — F909 Attention-deficit hyperactivity disorder, unspecified type: Secondary | ICD-10-CM

## 2014-11-16 MED ORDER — AMPHETAMINE-DEXTROAMPHET ER 25 MG PO CP24: 25.0000 mg | ORAL_CAPSULE | ORAL | Status: DC

## 2014-11-16 MED ORDER — AMPHETAMINE-DEXTROAMPHETAMINE 10 MG PO TABS: 10.0000 mg | ORAL_TABLET | Freq: Every day | ORAL | Status: DC

## 2014-11-16 NOTE — Patient Instructions (Addendum)
-  It was nice to meet you today  -Please schedule appointment with the psychiatrist for management of your psych medications

## 2014-11-16 NOTE — Progress Notes (Signed)
HPI:  Depression/Anxiety/ADD: -really struggled with loss of son in 2014, and father - now doing well -meds: on adderall 25 mg in the morning and 10mg  at night, ambien nightly, pristiq and klonopin prn -wellbutrin and effexor tried in the past and reports she did not tolerate -wants refills on adderal -doing well on current regimen -reports the pristiq is expensive, appears Dr. Lenna Sciara tried to obtain tier exception but denied, she is willing to cont to pay until sees psych to see if other options or assistance for this -denies: SI, thoughts of self harm, tics, BP problems, HA  ROS: See pertinent positives and negatives per HPI.  Past Medical History  Diagnosis Date  . Allergy   . Depression   . History of mononucleosis   . Chronic sinusitis     Past Surgical History  Procedure Laterality Date  . Cesarean section      No family history on file.  History   Social History  . Marital Status: Married    Spouse Name: N/A  . Number of Children: N/A  . Years of Education: N/A   Social History Main Topics  . Smoking status: Never Smoker   . Smokeless tobacco: Never Used  . Alcohol Use: Yes  . Drug Use: No  . Sexual Activity: Not on file   Other Topics Concern  . None   Social History Narrative     Current outpatient prescriptions:  .  amphetamine-dextroamphetamine (ADDERALL XR) 25 MG 24 hr capsule, Take 1 capsule by mouth every morning., Disp: 90 capsule, Rfl: 0 .  amphetamine-dextroamphetamine (ADDERALL) 10 MG tablet, Take 1 tablet (10 mg total) by mouth daily., Disp: 90 tablet, Rfl: 0 .  clonazePAM (KLONOPIN) 0.5 MG tablet, Take 1 tablet (0.5 mg total) by mouth 2 (two) times daily as needed for anxiety., Disp: 30 tablet, Rfl: 1 .  cyclobenzaprine (FLEXERIL) 10 MG tablet, Take 10 mg by mouth 3 (three) times daily as needed., Disp: , Rfl:  .  desvenlafaxine (PRISTIQ) 50 MG 24 hr tablet, Take 1 tablet (50 mg total) by mouth daily., Disp: 90 tablet, Rfl: 0 .   fexofenadine-pseudoephedrine (ALLEGRA-D) 60-120 MG per tablet, Take 1 tablet by mouth daily., Disp: , Rfl:  .  mometasone (NASONEX) 50 MCG/ACT nasal spray, Place 2 sprays into the nose daily., Disp: 17 g, Rfl: 11 .  valACYclovir (VALTREX) 500 MG tablet, TAKE 1 TABLET TWICE A DAY FOR 5 DAYS, Disp: 10 tablet, Rfl: 1 .  zolpidem (AMBIEN) 10 MG tablet, TAKE 1 TABLET BY MOUTH AT BEDTIME, Disp: 30 tablet, Rfl: 5  EXAM:  Filed Vitals:   11/16/14 1305  BP: 122/82  Pulse: 104  Temp: 98.6 F (37 C)    Body mass index is 32.69 kg/(m^2).  GENERAL: vitals reviewed and listed above, alert, oriented, appears well hydrated and in no acute distress  HEENT: atraumatic, conjunttiva clear, no obvious abnormalities on inspection of external nose and ears  NECK: no obvious masses on inspection  LUNGS: clear to auscultation bilaterally, no wheezes, rales or rhonchi, good air movement  CV: HRRR, no peripheral edema  MS: moves all extremities without noticeable abnormality  PSYCH: pleasant and cooperative, no obvious depression or anxiety  ASSESSMENT AND PLAN:  Discussed the following assessment and plan:  Attention deficit disorder - Plan: amphetamine-dextroamphetamine (ADDERALL) 10 MG tablet  -advised to see psych given multiple psych medications and diagnoses and issues with costs with current regimen, intol to other option -advised I do not prescribe controlled substances for  chronic regular use in adults -she is agreeable to this, refill provided to get to psych appt and number provided to schedule psych visit -Patient advised to return or notify a doctor immediately if symptoms worsen or persist or new concerns arise.  Patient Instructions  -Please schedule appointment with the psychiatrist for management of your psych medications       Kristi Hill.

## 2014-11-16 NOTE — Progress Notes (Signed)
Pre visit review using our clinic review tool, if applicable. No additional management support is needed unless otherwise documented below in the visit note. 

## 2014-12-05 ENCOUNTER — Telehealth: Payer: Self-pay | Admitting: Internal Medicine

## 2014-12-05 MED ORDER — DESVENLAFAXINE SUCCINATE ER 50 MG PO TB24
50.0000 mg | ORAL_TABLET | Freq: Every day | ORAL | Status: DC
Start: 2014-12-05 — End: 2015-01-02

## 2014-12-05 NOTE — Telephone Encounter (Signed)
Advised at last appointment she needs to see psych. Provided enough refills/bridge of meds to get to that appointment. Can give one month of pristiq but no further controlled med refills. Thanks.

## 2014-12-05 NOTE — Addendum Note (Signed)
Addended by: Agnes Lawrence on: 12/05/2014 04:09 PM   Modules accepted: Orders

## 2014-12-05 NOTE — Telephone Encounter (Signed)
Patient informed and she is aware a refill on Pristiq was sent to her pharmacy.

## 2014-12-05 NOTE — Telephone Encounter (Signed)
Patient is requesting re-fills on desvenlafaxine (PRISTIQ) 50 MG 24 hr tablet and zolpidem (AMBIEN) 10 MG tablet sent to [id number] [place], [place].

## 2014-12-26 ENCOUNTER — Telehealth: Payer: Self-pay | Admitting: Internal Medicine

## 2014-12-26 NOTE — Telephone Encounter (Signed)
Caller name: [name] Relation to pt: self  Call back number: [phone]   Reason for call:   Kristi Hill referred by her mother who is an established pt of yours Kristi Hill  D.O.B 03/14/1943 would like to establish care with you. Pt stated once Dr. Arnoldo Morale stopped practicing she tried out a physician who does not prescribed antidepressant such as desvenlafaxine (PRISTIQ) 50 MG 24 hr tablet. Pt was prescribed a one month supply and would like to know if you can take her on as a new patient. Pt stated Dr. Arnoldo Morale prescribed this particular medication for several years. Please advise

## 2014-12-27 NOTE — Telephone Encounter (Signed)
Ok to schedule.

## 2014-12-29 NOTE — Telephone Encounter (Signed)
LVM advising pt to call to schedule new patient appointment

## 2015-01-02 ENCOUNTER — Other Ambulatory Visit: Payer: Self-pay | Admitting: Family Medicine

## 2015-01-02 NOTE — Telephone Encounter (Signed)
I spoke with the pt and she stated she would like to see Dr Birdie Riddle since she lives closer to [place] and she has an appt for 03/15/2015.  States Dr Tabori''s office told her she will be put on a waiting list and may be able to get in sooner.  She wanted to know if Dr Maudie Mercury would give her a 90-day supply of Pristiq to save money on her copay and she would need more than a months supply in case she is not seen until 6/23?

## 2015-01-02 NOTE — Telephone Encounter (Signed)
It appears she is establishing care with a new provider at another site instead of with me as is scheduled? Please cancel her NPV with me so that she does not get a charge for that.. If has appointment with another provider and wants to continue the pristiq until that appt with new provider can fill to that visit once she has appointment set up. Otherwise no further refills. Thanks.

## 2015-01-18 ENCOUNTER — Encounter: Payer: Self-pay | Admitting: Family Medicine

## 2015-01-18 ENCOUNTER — Ambulatory Visit (INDEPENDENT_AMBULATORY_CARE_PROVIDER_SITE_OTHER): Payer: Managed Care, Other (non HMO) | Admitting: Family Medicine

## 2015-01-18 VITALS — BP 120/78 | HR 101 | Temp 98.4°F | Resp 16 | Ht 68.25 in | Wt 219.5 lb

## 2015-01-18 DIAGNOSIS — F909 Attention-deficit hyperactivity disorder, unspecified type: Secondary | ICD-10-CM | POA: Diagnosis not present

## 2015-01-18 DIAGNOSIS — F329 Major depressive disorder, single episode, unspecified: Secondary | ICD-10-CM

## 2015-01-18 DIAGNOSIS — G4700 Insomnia, unspecified: Secondary | ICD-10-CM | POA: Diagnosis not present

## 2015-01-18 DIAGNOSIS — F988 Other specified behavioral and emotional disorders with onset usually occurring in childhood and adolescence: Secondary | ICD-10-CM

## 2015-01-18 DIAGNOSIS — F32A Depression, unspecified: Secondary | ICD-10-CM

## 2015-01-18 MED ORDER — ZOLPIDEM TARTRATE 10 MG PO TABS: 10.0000 mg | ORAL_TABLET | Freq: Every day | ORAL | Status: DC

## 2015-01-18 NOTE — Assessment & Plan Note (Signed)
New to provider, ongoing for pt.  sxs are well controlled on current medications.  Pt gets 90 day supply.  Has signed controlled substance agreement on file.  Will provide refills as needed.

## 2015-01-18 NOTE — Progress Notes (Signed)
   Subjective:    Patient ID: Laurine Blazer, female    DOB: 1971-02-26, 44 y.o.   MRN: [id number]  HPI New to establish.  Previous MD- [facility]  ADD- chronic problem, inattentive predominant.  Taking 25mg  XR in the AM and 10mg  short acting in the afternoon.  Pt reports good control of sxs.  Denies palpitations, anorexia.  Pt requesting 90 day prescriptions  Depression- chronic problem, on Pristiq daily.  Pt was previously on Prozac, Wellbutrin, Lexapro.  Pt reports monthly cost for medication is too much.  Pt reports medication is working and 'i don't want to change'.  Pt lost her son and also found her father when he died.  Insomnia- chronic problem, on Ambien nightly.  Pt reports she is 'miserable' w/o medication.   Review of Systems For ROS see HPI     Objective:   Physical Exam  Constitutional: She is oriented to person, place, and time. She appears well-developed and well-nourished. No distress.  HENT:  Head: Normocephalic and atraumatic.  Eyes: Conjunctivae and EOM are normal. Pupils are equal, round, and reactive to light.  Neck: Normal range of motion. Neck supple. No thyromegaly present.  Cardiovascular: Normal rate, regular rhythm, normal heart sounds and intact distal pulses.   No murmur heard. Pulmonary/Chest: Effort normal and breath sounds normal. No respiratory distress.  Abdominal: Soft. She exhibits no distension. There is no tenderness.  Musculoskeletal: She exhibits no edema.  Lymphadenopathy:    She has no cervical adenopathy.  Neurological: She is alert and oriented to person, place, and time.  Skin: Skin is warm and dry.  Psychiatric: She has a normal mood and affect. Her behavior is normal.  Vitals reviewed.         Assessment & Plan:

## 2015-01-18 NOTE — Assessment & Plan Note (Signed)
New to provider, ongoing for pt.  sxs are well controlled on Pristiq after failing multiple other medications.  The only problem at this time is the cost of the medication.  While pt was in office I signed her up for a co-pay card.  Pt will see how much the meds cost w/ the copay card and if it's still expensive we will adjust tx at that time.  Pt expressed understanding and is in agreement w/ plan.

## 2015-01-18 NOTE — Patient Instructions (Signed)
Schedule your complete physical in 6 months Let us know when you need refills on medications If the Pristiq ends up being too expensive, let me know Call with any questions or concerns Welcome!  We're glad to have you!!!

## 2015-01-18 NOTE — Assessment & Plan Note (Signed)
New to provider, ongoing for pt.  Unable to sleep w/o Ambien.  Refill provided.

## 2015-01-18 NOTE — Progress Notes (Signed)
Pre visit review using our clinic review tool, if applicable. No additional management support is needed unless otherwise documented below in the visit note. 

## 2015-01-26 ENCOUNTER — Ambulatory Visit: Payer: Managed Care, Other (non HMO) | Admitting: Family Medicine

## 2015-02-20 ENCOUNTER — Telehealth: Payer: Self-pay | Admitting: Family Medicine

## 2015-02-20 DIAGNOSIS — F988 Other specified behavioral and emotional disorders with onset usually occurring in childhood and adolescence: Secondary | ICD-10-CM

## 2015-02-20 NOTE — Telephone Encounter (Signed)
Relation to KI:CHTV Call back number: [phone] Pharmacy:  Reason for call:  Pt requesting a refill amphetamine-dextroamphetamine (ADDERALL XR) 25 MG 24 hr capsule and amphetamine-dextroamphetamine (ADDERALL) 10 MG tablet

## 2015-02-21 MED ORDER — AMPHETAMINE-DEXTROAMPHETAMINE 10 MG PO TABS: 10.0000 mg | ORAL_TABLET | Freq: Every day | ORAL | Status: DC

## 2015-02-21 MED ORDER — AMPHETAMINE-DEXTROAMPHET ER 25 MG PO CP24: 25.0000 mg | ORAL_CAPSULE | ORAL | Status: DC

## 2015-02-21 NOTE — Telephone Encounter (Signed)
[facility] for #90 of each

## 2015-02-21 NOTE — Telephone Encounter (Signed)
Meds filled, placed at front desk for pick up and pt notified.

## 2015-02-21 NOTE — Telephone Encounter (Signed)
Last OV 01/18/15 adderall last filled 11/16/14 #90 with 0

## 2015-03-15 ENCOUNTER — Ambulatory Visit: Payer: BLUE CROSS/BLUE SHIELD | Admitting: Family Medicine

## 2015-03-26 ENCOUNTER — Other Ambulatory Visit: Payer: Self-pay | Admitting: Family Medicine

## 2015-05-24 ENCOUNTER — Telehealth: Payer: Self-pay | Admitting: Family Medicine

## 2015-05-24 DIAGNOSIS — F988 Other specified behavioral and emotional disorders with onset usually occurring in childhood and adolescence: Secondary | ICD-10-CM

## 2015-05-24 MED ORDER — AMPHETAMINE-DEXTROAMPHETAMINE 10 MG PO TABS: 10.0000 mg | ORAL_TABLET | Freq: Every day | ORAL | Status: DC

## 2015-05-24 MED ORDER — AMPHETAMINE-DEXTROAMPHET ER 25 MG PO CP24: 25.0000 mg | ORAL_CAPSULE | ORAL | Status: DC

## 2015-05-24 NOTE — Telephone Encounter (Signed)
Relation to BO:ERQS Call back number:[phone]   Reason for call:  Patient requesting a refill amphetamine-dextroamphetamine (ADDERALL) 10 MG tablet

## 2015-05-24 NOTE — Telephone Encounter (Signed)
Last OV 12/29/14 adderall last filled 02/21/15 #90 with 0

## 2015-05-24 NOTE — Telephone Encounter (Signed)
Ok for 90

## 2015-06-14 ENCOUNTER — Encounter: Payer: Self-pay | Admitting: Family Medicine

## 2015-06-14 ENCOUNTER — Ambulatory Visit (INDEPENDENT_AMBULATORY_CARE_PROVIDER_SITE_OTHER): Payer: Managed Care, Other (non HMO) | Admitting: Family Medicine

## 2015-06-14 VITALS — BP 126/82 | HR 98 | Temp 98.4°F | Resp 17 | Wt 231.2 lb

## 2015-06-14 DIAGNOSIS — M609 Myositis, unspecified: Secondary | ICD-10-CM

## 2015-06-14 DIAGNOSIS — M791 Myalgia: Secondary | ICD-10-CM

## 2015-06-14 DIAGNOSIS — R5383 Other fatigue: Secondary | ICD-10-CM | POA: Diagnosis not present

## 2015-06-14 DIAGNOSIS — IMO0001 Reserved for inherently not codable concepts without codable children: Secondary | ICD-10-CM

## 2015-06-14 MED ORDER — MELOXICAM 15 MG PO TABS: 15.0000 mg | ORAL_TABLET | Freq: Every day | ORAL | Status: DC

## 2015-06-14 NOTE — Progress Notes (Signed)
Pre visit review using our clinic review tool, if applicable. No additional management support is needed unless otherwise documented below in the visit note. 

## 2015-06-14 NOTE — Patient Instructions (Addendum)
We'll determine the next steps based on your lab results Start the Mobic once daily- take w/ food Try and wear good, supportive shoes (sneakers) Alternate ice/heat for pain/swelling Try and make healthy food choices and get regular exercise Call with any questions or concerns If you want to join [place] at the new [place] office, any scheduled appointments will automatically transfer and we will see you at [address] [place], [place], [place] [address]  Hang in there!!!

## 2015-06-14 NOTE — Progress Notes (Signed)
   Subjective:    Patient ID: Kristi Hill, female    DOB: 04-20-1971, 44 y.o.   MRN: [id number]  HPI Fatigue- 'slowly but gradually over the last couple months'.  Pt is having bilateral muscle aches of lower legs and arms.  No foot pain.  Painful to pick up water bottle.  Lower legs are TTP, particularly 1st thing in the AM.  Pt was told by a friend that her sxs sound consistent w/ Lyme disease.  No fevers.  Tires easily.     Review of Systems For ROS see HPI     Objective:   Physical Exam  Constitutional: She is oriented to person, place, and time. She appears well-developed and well-nourished. No distress.  HENT:  Head: Normocephalic and atraumatic.  Neck: Normal range of motion. Neck supple.  Cardiovascular: Intact distal pulses.   Musculoskeletal: Normal range of motion. She exhibits tenderness (mild TTP over achilles tendons bilaterally.  no reproducible TTP over arms). She exhibits no edema.  Lymphadenopathy:    She has no cervical adenopathy.  Neurological: She is alert and oriented to person, place, and time. She has normal reflexes. No cranial nerve deficit. Coordination normal.  Skin: Skin is warm and dry. No erythema.  Psychiatric: She has a normal mood and affect. Her behavior is normal. Thought content normal.  Vitals reviewed.         Assessment & Plan:

## 2015-06-15 LAB — CBC WITH DIFFERENTIAL/PLATELET
BASOS PCT: 0.5 % (ref 0.0–3.0)
Basophils Absolute: 0 10*3/uL (ref 0.0–0.1)
EOS PCT: 1.9 % (ref 0.0–5.0)
Eosinophils Absolute: 0.1 10*3/uL (ref 0.0–0.7)
HEMATOCRIT: 43.5 % (ref 36.0–46.0)
HEMOGLOBIN: 14.4 g/dL (ref 12.0–15.0)
LYMPHS PCT: 34 % (ref 12.0–46.0)
Lymphs Abs: 1.8 10*3/uL (ref 0.7–4.0)
MCHC: 33 g/dL (ref 30.0–36.0)
MCV: 94.8 fl (ref 78.0–100.0)
MONOS PCT: 10.1 % (ref 3.0–12.0)
Monocytes Absolute: 0.5 10*3/uL (ref 0.1–1.0)
Neutro Abs: 2.9 10*3/uL (ref 1.4–7.7)
Neutrophils Relative %: 53.5 % (ref 43.0–77.0)
Platelets: 307 10*3/uL (ref 150.0–400.0)
RBC: 4.59 Mil/uL (ref 3.87–5.11)
RDW: 13.4 % (ref 11.5–15.5)
WBC: 5.4 10*3/uL (ref 4.0–10.5)

## 2015-06-15 LAB — BASIC METABOLIC PANEL
BUN: 19 mg/dL (ref 6–23)
CHLORIDE: 104 meq/L (ref 96–112)
CO2: 27 meq/L (ref 19–32)
Calcium: 9.3 mg/dL (ref 8.4–10.5)
Creatinine, Ser: 0.94 mg/dL (ref 0.40–1.20)
GFR: 69.28 mL/min (ref >60.00)
Glucose, Bld: 87 mg/dL (ref 70–99)
POTASSIUM: 4.6 meq/L (ref 3.5–5.1)
SODIUM: 138 meq/L (ref 135–145)

## 2015-06-15 LAB — LYME AB/WESTERN BLOT REFLEX: B BURGDORFERI AB IGG+ IGM: 0.18 {ISR}

## 2015-06-15 LAB — SEDIMENTATION RATE: SED RATE: 7 mm/h (ref 0–22)

## 2015-06-15 LAB — ANA: Anti Nuclear Antibody(ANA): NEGATIVE

## 2015-06-15 LAB — TSH: TSH: 0.89 u[IU]/mL (ref 0.35–4.50)

## 2015-06-15 NOTE — Assessment & Plan Note (Signed)
New.  Suspect that her bilateral achilles tenderness is a red herring and not related to her arm pain.  Discussed need for supportive footwear as her sxs are worse in flip flops.  No TTP of UEs on exam today.  Full ROM.  Check labs to r/o underlying cause.  Start daily NSAID.  If no improvement, will need rheum or sports med referral.  Pt expressed understanding and is in agreement w/ plan.

## 2015-06-15 NOTE — Assessment & Plan Note (Signed)
New.  + weight gain, fatigue, myalgias.  Check labs to r/o thyroid or other metabolic/rheumatologic cause.  Discussed need for regular exercise, improved physical fitness.  Must consider fibro if all labs and other work up are normal.  Will follow.

## 2015-07-24 ENCOUNTER — Telehealth: Payer: Self-pay | Admitting: Behavioral Health

## 2015-07-24 NOTE — Telephone Encounter (Signed)
Unable to reach patient at time of Pre-Visit Call.  Left message for patient to return call when available.    

## 2015-07-25 ENCOUNTER — Ambulatory Visit (INDEPENDENT_AMBULATORY_CARE_PROVIDER_SITE_OTHER): Payer: Managed Care, Other (non HMO) | Admitting: Family Medicine

## 2015-07-25 ENCOUNTER — Encounter: Payer: Self-pay | Admitting: General Practice

## 2015-07-25 ENCOUNTER — Encounter: Payer: Self-pay | Admitting: Family Medicine

## 2015-07-25 VITALS — BP 124/84 | HR 89 | Temp 98.1°F | Resp 16 | Ht 68.0 in | Wt 232.5 lb

## 2015-07-25 DIAGNOSIS — Z0000 Encounter for general adult medical examination without abnormal findings: Secondary | ICD-10-CM | POA: Diagnosis not present

## 2015-07-25 DIAGNOSIS — M7711 Lateral epicondylitis, right elbow: Secondary | ICD-10-CM | POA: Diagnosis not present

## 2015-07-25 LAB — HEPATIC FUNCTION PANEL
ALT: 9 U/L (ref 0–35)
AST: 20 U/L (ref 0–37)
Albumin: 3.8 g/dL (ref 3.5–5.2)
Alkaline Phosphatase: 42 U/L (ref 39–117)
Bilirubin, Direct: 0.1 mg/dL (ref 0.0–0.3)
TOTAL PROTEIN: 6.8 g/dL (ref 6.0–8.3)
Total Bilirubin: 0.4 mg/dL (ref 0.2–1.2)

## 2015-07-25 LAB — LIPID PANEL
CHOLESTEROL: 126 mg/dL (ref 0–200)
HDL: 68 mg/dL (ref >39.00)
LDL CALC: 45 mg/dL (ref 0–99)
NonHDL: 58.49
TRIGLYCERIDES: 69 mg/dL (ref 0.0–149.0)
Total CHOL/HDL Ratio: 2
VLDL: 13.8 mg/dL (ref 0.0–40.0)

## 2015-07-25 LAB — VITAMIN D 25 HYDROXY (VIT D DEFICIENCY, FRACTURES): VITD: 72.75 ng/mL (ref 30.00–100.00)

## 2015-07-25 MED ORDER — FEXOFENADINE-PSEUDOEPHED ER 60-120 MG PO TB12: 1.0000 | ORAL_TABLET | Freq: Every day | ORAL | Status: AC

## 2015-07-25 NOTE — Progress Notes (Signed)
   Subjective:    Patient ID: Kristi Hill, female    DOB: 05/20/1971, 44 y.o.   MRN: [id number]  HPI CPE- has GYN appt scheduled next month [facility]).  R elbow pain.  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  Pain will cause her to drop things when attempting to carry or lift them.   Review of Systems Patient reports no vision/ hearing changes, adenopathy,fever, weight change,  persistant/recurrent hoarseness , swallowing issues, chest pain, palpitations, edema, persistant/recurrent cough, hemoptysis, dyspnea (rest/exertional/paroxysmal nocturnal), gastrointestinal bleeding (melena, rectal bleeding), abdominal pain, significant heartburn, bowel changes, GU symptoms (dysuria, hematuria, incontinence), Gyn symptoms (abnormal  bleeding, pain),  syncope, focal weakness, memory loss, numbness & tingling, skin/hair/nail changes, abnormal bruising or bleeding, anxiety, or depression.     Objective:   Physical Exam General Appearance:    Alert, cooperative, no distress, appears stated age  Head:    Normocephalic, without obvious abnormality, atraumatic  Eyes:    PERRL, conjunctiva/corneas clear, EOM's intact, fundi    benign, both eyes  Ears:    Normal TM's and external ear canals, both ears  Nose:   Nares normal, septum midline, mucosa normal, no drainage    or sinus tenderness  Throat:   Lips, mucosa, and tongue normal; teeth and gums normal  Neck:   Supple, symmetrical, trachea midline, no adenopathy;    Thyroid: no enlargement/tenderness/nodules  Back:     Symmetric, no curvature, ROM normal, no CVA tenderness  Lungs:     Clear to auscultation bilaterally, respirations unlabored  Chest Wall:    No tenderness or deformity   Heart:    Regular rate and rhythm, S1 and S2 normal, no murmur, rub   or gallop  Breast Exam:    Deferred to GYN  Abdomen:     Soft, non-tender, bowel sounds active all four quadrants,    no masses, no organomegaly  Genitalia:     Deferred to GYN  Rectal:    Extremities:   Extremities normal, atraumatic, no cyanosis or edema  Pulses:   2+ and symmetric all extremities  Skin:   Skin color, texture, turgor normal, no rashes or lesions  Lymph nodes:   Cervical, supraclavicular, and axillary nodes normal  Neurologic:   CNII-XII intact, normal strength, sensation and reflexes    throughout          Assessment & Plan:

## 2015-07-25 NOTE — Assessment & Plan Note (Signed)
Pt's PE WNL w/ exception of being overweight.  UTD on GYN.  Reviewed recent labs- will get remaining labs today.  Anticipatory guidance provided.

## 2015-07-25 NOTE — Patient Instructions (Signed)
Follow up in 1 year or as needed We'll notify you of your lab results and make any changes if needed Continue to work on healthy diet and regular exercise- you can do it! Have GYN send me a copy of your mammo We'll call you with your Sports Med appt Call with any questions or concerns If you want to join [place] at the new [place] office, any scheduled appointments will automatically transfer and we will see you at [address] [place], [place] [address]  Happy Holidays!!!

## 2015-07-25 NOTE — Assessment & Plan Note (Signed)
New to provider, ongoing for pt.  Refer to Sports Med

## 2015-07-25 NOTE — Progress Notes (Signed)
Pre visit review using our clinic review tool, if applicable. No additional management support is needed unless otherwise documented below in the visit note. 

## 2015-07-26 ENCOUNTER — Encounter: Payer: Self-pay | Admitting: Family Medicine

## 2015-07-26 ENCOUNTER — Ambulatory Visit (INDEPENDENT_AMBULATORY_CARE_PROVIDER_SITE_OTHER): Payer: Managed Care, Other (non HMO) | Admitting: Family Medicine

## 2015-07-26 ENCOUNTER — Encounter (INDEPENDENT_AMBULATORY_CARE_PROVIDER_SITE_OTHER): Payer: Self-pay

## 2015-07-26 VITALS — BP 129/92 | HR 92 | Ht 69.0 in | Wt 210.0 lb

## 2015-07-26 DIAGNOSIS — M7711 Lateral epicondylitis, right elbow: Secondary | ICD-10-CM

## 2015-07-26 MED ORDER — NITROGLYCERIN 0.2 MG/HR TD PT24: MEDICATED_PATCH | TRANSDERMAL | Status: DC

## 2015-07-26 NOTE — Patient Instructions (Signed)
You have lateral epicondylitis Try to avoid painful activities as much as possible. Ice the area 3-4 times a day for 15 minutes at a time. Tylenol or aleve as needed for pain. Counterforce brace as directed can help unload area - wear this regularly if it provides you with relief. Hammer rotation exercise, wrist extension exercise with 1 pound weight - 3 sets of 10 once a day.   Stretching - hold for 20-30 seconds and repeat 3 times. Nitro patches - 1/4th patch over affected elbow, change daily. Consider physical therapy, injection if not improving. Follow up in 6 weeks.   

## 2015-07-31 NOTE — Assessment & Plan Note (Signed)
shown home exercises to do daily.  Icing, nsaids as needed.  Counterforce brace.  She would like to start nitro patches - discussed risks of headache, skin irritation.  Consider PT, injection if not improving.  F/u in 6 weeks.

## 2015-07-31 NOTE — Progress Notes (Signed)
PCP and consultation requested by: Annye Asa, MD  Subjective:   HPI: Patient is a 44 y.o. female here for right elbow pain.  Patient reports for 2 months she's had lateral right elbow pain. Pain level 3/10, sharp. Pain worse with holding items. Recalls pulling carts for travel softball but no injury when she did this. No swelling, bruising. Is right handed. No skin changes, fever, other complaints.  Past Medical History  Diagnosis Date  . Allergy   . Depression   . History of mononucleosis   . Chronic sinusitis     Current Outpatient Prescriptions on File Prior to Visit  Medication Sig Dispense Refill  . amphetamine-dextroamphetamine (ADDERALL XR) 25 MG 24 hr capsule Take 1 capsule by mouth every morning. 90 capsule 0  . amphetamine-dextroamphetamine (ADDERALL) 10 MG tablet Take 1 tablet (10 mg total) by mouth daily. 90 tablet 0  . fexofenadine-pseudoephedrine (ALLEGRA-D) 60-120 MG 12 hr tablet Take 1 tablet by mouth daily. 30 tablet 3  . meloxicam (MOBIC) 15 MG tablet Take 1 tablet (15 mg total) by mouth daily. 30 tablet 1  . mometasone (NASONEX) 50 MCG/ACT nasal spray Place 2 sprays into the nose daily. 17 g 11  . PRISTIQ 50 MG 24 hr tablet TAKE 1 TABLET BY MOUTH EVERY DAY 90 tablet 1  . valACYclovir (VALTREX) 500 MG tablet TAKE 1 TABLET TWICE A DAY FOR 5 DAYS 10 tablet 1  . zolpidem (AMBIEN) 10 MG tablet Take 1 tablet (10 mg total) by mouth at bedtime. 30 tablet 3   No current facility-administered medications on file prior to visit.    Past Surgical History  Procedure Laterality Date  . Cesarean section      Allergies  Allergen Reactions  . Sulfamethoxazole-Trimethoprim     REACTION: hives    Social History   Social History  . Marital Status: Married    Spouse Name: N/A  . Number of Children: N/A  . Years of Education: N/A   Occupational History  . Not on file.   Social History Main Topics  . Smoking status: Never Smoker   . Smokeless tobacco:  Never Used  . Alcohol Use: 0.0 oz/week    0 Standard drinks or equivalent per week  . Drug Use: No  . Sexual Activity: Not on file   Other Topics Concern  . Not on file   Social History Narrative    Family History  Problem Relation Age of Onset  . Heart disease Father   . Heart disease Maternal Grandfather     BP 129/92 mmHg  Pulse 92  Ht 5\' 9"  (1.753 m)  Wt 210 lb (95.255 kg)  BMI 31.00 kg/m2  Review of Systems: See HPI above.    Objective:  Physical Exam:  Gen: NAD  Right elbow: No gross deformity, swelling, bruising. TTP lateral epicondyle and just distal to this. FROM elbow and wrist.  Pain on resisted wrist extension, 3rd digit extension. Negative tinels radial and cubital tunnels. NVI distally.  Left elbow: FROM without pain.    Assessment & Plan:  1. Right lateral epicondylitis - shown home exercises to do daily.  Icing, nsaids as needed.  Counterforce brace.  She would like to start nitro patches - discussed risks of headache, skin irritation.  Consider PT, injection if not improving.  F/u in 6 weeks.

## 2015-08-29 ENCOUNTER — Other Ambulatory Visit: Payer: Self-pay | Admitting: Family Medicine

## 2015-08-29 DIAGNOSIS — F988 Other specified behavioral and emotional disorders with onset usually occurring in childhood and adolescence: Secondary | ICD-10-CM

## 2015-08-29 MED ORDER — AMPHETAMINE-DEXTROAMPHET ER 25 MG PO CP24
25.0000 mg | ORAL_CAPSULE | ORAL | Status: DC
Start: 2015-08-29 — End: 2016-03-03

## 2015-08-29 MED ORDER — AMPHETAMINE-DEXTROAMPHETAMINE 10 MG PO TABS: 10.0000 mg | ORAL_TABLET | Freq: Every day | ORAL | Status: DC

## 2015-08-29 NOTE — Telephone Encounter (Signed)
Med filled and pt informed available at front desk for pick up.  

## 2015-08-29 NOTE — Telephone Encounter (Signed)
Caller name:n Sherriann   Relationship to patient: Self   Can be reached:[phone]  Pharmacy:  Reason for call: Pt is requesting a refill on both of her Adderall prescriptions

## 2015-08-29 NOTE — Telephone Encounter (Signed)
Last OV 07/25/15 Both adderalls last filled 05/24/15 #90 with 0

## 2015-09-13 ENCOUNTER — Other Ambulatory Visit: Payer: Self-pay | Admitting: Family Medicine

## 2015-09-13 NOTE — Telephone Encounter (Signed)
Medication filled to pharmacy as requested.   

## 2015-09-26 ENCOUNTER — Other Ambulatory Visit: Payer: Self-pay | Admitting: Family Medicine

## 2015-09-26 MED ORDER — ZOLPIDEM TARTRATE 10 MG PO TABS: 10.0000 mg | ORAL_TABLET | Freq: Every day | ORAL | Status: DC

## 2015-09-26 NOTE — Telephone Encounter (Signed)
[facility]  

## 2015-09-26 NOTE — Telephone Encounter (Signed)
Requesting:  zolpidem Contract  None UDS  None Last OV   07/25/2015 Last Refill   #30 with 3 refills on 01/18/2015  Please Advise

## 2015-09-26 NOTE — Telephone Encounter (Signed)
[facility] [place] 

## 2015-11-29 ENCOUNTER — Other Ambulatory Visit: Payer: Self-pay | Admitting: Family Medicine

## 2015-11-29 NOTE — Telephone Encounter (Signed)
Medication filled to pharmacy as requested.   

## 2015-12-03 ENCOUNTER — Other Ambulatory Visit: Payer: Self-pay | Admitting: Family Medicine

## 2015-12-03 DIAGNOSIS — F988 Other specified behavioral and emotional disorders with onset usually occurring in childhood and adolescence: Secondary | ICD-10-CM

## 2015-12-03 MED ORDER — AMPHETAMINE-DEXTROAMPHET ER 25 MG PO CP24: 25.0000 mg | ORAL_CAPSULE | ORAL | Status: DC

## 2015-12-03 MED ORDER — AMPHETAMINE-DEXTROAMPHETAMINE 10 MG PO TABS: 10.0000 mg | ORAL_TABLET | Freq: Every day | ORAL | Status: DC

## 2015-12-03 NOTE — Telephone Encounter (Signed)
Last OV 07/25/15 adderall 25mg  last filled 08/29/15 #90 with 0 adderall 10mg  last filled 08/29/15 #90 with 0

## 2015-12-03 NOTE — Telephone Encounter (Signed)
Pt called for adderall 25mg  xr and 10mg  reg tab. She has 1 of each left. Please call when RX ready for pick up at Ph# [phone].

## 2015-12-03 NOTE — Telephone Encounter (Signed)
Medication filled to pharmacy as requested.   

## 2016-03-03 ENCOUNTER — Other Ambulatory Visit: Payer: Self-pay | Admitting: Family Medicine

## 2016-03-03 DIAGNOSIS — F988 Other specified behavioral and emotional disorders with onset usually occurring in childhood and adolescence: Secondary | ICD-10-CM

## 2016-03-03 MED ORDER — AMPHETAMINE-DEXTROAMPHETAMINE 10 MG PO TABS: 10.0000 mg | ORAL_TABLET | Freq: Every day | ORAL | Status: DC

## 2016-03-03 MED ORDER — DESVENLAFAXINE SUCCINATE ER 50 MG PO TB24: 50.0000 mg | ORAL_TABLET | Freq: Every day | ORAL | Status: DC

## 2016-03-03 MED ORDER — AMPHETAMINE-DEXTROAMPHET ER 25 MG PO CP24: 25.0000 mg | ORAL_CAPSULE | ORAL | Status: DC

## 2016-03-03 NOTE — Telephone Encounter (Signed)
Pt asking for refill on both adderall and pristia (desvenlafaxine suc ER) pt states that she has ran out of the adderall and will be going out of town and will run out of the other before she gets back. Pt also asking for a 90 day supply on all refills.

## 2016-03-03 NOTE — Telephone Encounter (Signed)
Last OV 07/25/15 (cpe) adderalls last filled 12/03/15 #90 with 0 pristiq last filled 09/13/15 #90 with 1

## 2016-03-03 NOTE — Telephone Encounter (Signed)
Med filled and pt advised that these are available at the front desk for pick up.

## 2016-03-17 ENCOUNTER — Other Ambulatory Visit: Payer: Self-pay | Admitting: General Practice

## 2016-03-17 MED ORDER — DESVENLAFAXINE SUCCINATE ER 50 MG PO TB24: 50.0000 mg | ORAL_TABLET | Freq: Every day | ORAL | Status: DC

## 2016-04-15 ENCOUNTER — Other Ambulatory Visit: Payer: Self-pay | Admitting: General Practice

## 2016-04-15 MED ORDER — ZOLPIDEM TARTRATE 10 MG PO TABS: 10.0000 mg | ORAL_TABLET | Freq: Every day | ORAL | 3 refills | Status: DC

## 2016-04-15 NOTE — Telephone Encounter (Signed)
Medication filled to pharmacy as requested.   

## 2016-04-15 NOTE — Telephone Encounter (Signed)
Last OV 07/25/15 Zolpidem last filled 09/26/15 #30 with 3

## 2016-06-05 ENCOUNTER — Other Ambulatory Visit: Payer: Self-pay | Admitting: Family Medicine

## 2016-06-05 DIAGNOSIS — F988 Other specified behavioral and emotional disorders with onset usually occurring in childhood and adolescence: Secondary | ICD-10-CM

## 2016-06-05 MED ORDER — AMPHETAMINE-DEXTROAMPHETAMINE 10 MG PO TABS: 10.0000 mg | ORAL_TABLET | Freq: Every day | ORAL | 0 refills | Status: DC

## 2016-06-05 MED ORDER — AMPHETAMINE-DEXTROAMPHET ER 25 MG PO CP24: 25.0000 mg | ORAL_CAPSULE | ORAL | 0 refills | Status: DC

## 2016-06-05 NOTE — Telephone Encounter (Signed)
Med filled and pt made aware available at front desk for pick up.   

## 2016-06-05 NOTE — Telephone Encounter (Signed)
Pt needs refill on both adderall Rx for a 90 day supply, Pt would like a call when they are ready for p/u.

## 2016-06-05 NOTE — Telephone Encounter (Signed)
Last OV 07/25/15 (CPE) adderall XR last filled 03/03/16 #90 with 0 Adderall 10mg  last filled 03/03/16 #90 with 0

## 2016-09-08 ENCOUNTER — Other Ambulatory Visit: Payer: Self-pay | Admitting: Family Medicine

## 2016-09-08 DIAGNOSIS — F988 Other specified behavioral and emotional disorders with onset usually occurring in childhood and adolescence: Secondary | ICD-10-CM

## 2016-09-08 MED ORDER — AMPHETAMINE-DEXTROAMPHETAMINE 10 MG PO TABS: 10.0000 mg | ORAL_TABLET | Freq: Every day | ORAL | 0 refills | Status: DC

## 2016-09-08 MED ORDER — AMPHETAMINE-DEXTROAMPHET ER 25 MG PO CP24: 25.0000 mg | ORAL_CAPSULE | ORAL | 0 refills | Status: DC

## 2016-09-08 NOTE — Telephone Encounter (Signed)
Last OV 07/25/15 (no upcoming appts) adderall 25mg  XR last filled 06/05/16 #90 with 0 adderall 10mg  last filled 06/05/16 #90 with 0

## 2016-09-08 NOTE — Telephone Encounter (Signed)
Vander for refill on each but must schedule appt since it's been over a year

## 2016-09-08 NOTE — Telephone Encounter (Signed)
Medication filled to pharmacy as requested.   

## 2016-09-08 NOTE — Telephone Encounter (Signed)
Patient calling to request 90 day refill of:  amphetamine-dextroamphetamine (ADDERALL XR) 25 MG 24 hr capsule  amphetamine-dextroamphetamine (ADDERALL) 10 MG tablet

## 2016-10-03 ENCOUNTER — Other Ambulatory Visit: Payer: Self-pay | Admitting: Family Medicine

## 2016-10-03 NOTE — Telephone Encounter (Signed)
Last OV 07/25/15 (no upcoming appts) Zolpidem last filled 04/15/16 #30 with 3

## 2016-10-14 ENCOUNTER — Telehealth: Payer: Self-pay | Admitting: Family Medicine

## 2016-10-14 NOTE — Telephone Encounter (Signed)
Pt has made a f/u appt for next week and asking if KT will go ahead and fill Rx AMBIEN, pt states that she is out.

## 2016-10-15 MED ORDER — ZOLPIDEM TARTRATE 10 MG PO TABS: 10.0000 mg | ORAL_TABLET | Freq: Every day | ORAL | 0 refills | Status: DC

## 2016-10-15 NOTE — Telephone Encounter (Signed)
Last OV 07/25/15 (CPE) ambien last filled 04/15/16 #30 with 3

## 2016-10-15 NOTE — Telephone Encounter (Signed)
Ok for #30, no refills 

## 2016-10-15 NOTE — Addendum Note (Signed)
Addended by: Davis Gourd on: 10/15/2016 09:02 AM   Modules accepted: Orders

## 2016-10-15 NOTE — Telephone Encounter (Signed)
Medication filled to pharmacy as requested.   

## 2016-10-22 ENCOUNTER — Ambulatory Visit: Payer: Managed Care, Other (non HMO) | Admitting: Family Medicine

## 2016-10-30 ENCOUNTER — Ambulatory Visit (INDEPENDENT_AMBULATORY_CARE_PROVIDER_SITE_OTHER): Payer: Managed Care, Other (non HMO) | Admitting: Family Medicine

## 2016-10-30 ENCOUNTER — Other Ambulatory Visit: Payer: Self-pay | Admitting: Family Medicine

## 2016-10-30 ENCOUNTER — Encounter: Payer: Self-pay | Admitting: Family Medicine

## 2016-10-30 VITALS — BP 128/88 | HR 77 | Temp 98.6°F | Resp 16 | Ht 69.0 in | Wt 226.1 lb

## 2016-10-30 DIAGNOSIS — J019 Acute sinusitis, unspecified: Secondary | ICD-10-CM

## 2016-10-30 DIAGNOSIS — F988 Other specified behavioral and emotional disorders with onset usually occurring in childhood and adolescence: Secondary | ICD-10-CM | POA: Diagnosis not present

## 2016-10-30 DIAGNOSIS — F329 Major depressive disorder, single episode, unspecified: Secondary | ICD-10-CM

## 2016-10-30 DIAGNOSIS — F32A Depression, unspecified: Secondary | ICD-10-CM

## 2016-10-30 DIAGNOSIS — E669 Obesity, unspecified: Secondary | ICD-10-CM

## 2016-10-30 LAB — LIPID PANEL
Cholesterol: 139 mg/dL (ref <200)
HDL: 78 mg/dL (ref >50)
LDL CALC: 45 mg/dL (ref <100)
TRIGLYCERIDES: 78 mg/dL (ref <150)
Total CHOL/HDL Ratio: 1.8 Ratio (ref <5.0)
VLDL: 16 mg/dL (ref <30)

## 2016-10-30 MED ORDER — AMPHETAMINE-DEXTROAMPHET ER 25 MG PO CP24: 25.0000 mg | ORAL_CAPSULE | ORAL | 0 refills | Status: DC

## 2016-10-30 MED ORDER — DESVENLAFAXINE SUCCINATE ER 100 MG PO TB24: 100.0000 mg | ORAL_TABLET | Freq: Every day | ORAL | 1 refills | Status: DC

## 2016-10-30 MED ORDER — AMPHETAMINE-DEXTROAMPHETAMINE 10 MG PO TABS: 10.0000 mg | ORAL_TABLET | Freq: Every day | ORAL | 0 refills | Status: DC

## 2016-10-30 NOTE — Assessment & Plan Note (Signed)
Ongoing issue.  Pt reports this well controlled.  Med refills provided.  Will follow.

## 2016-10-30 NOTE — Assessment & Plan Note (Signed)
Pt's sxs are improving and her only current complaint is fatigue.  No need for additional abx, no need to change abx.  Reviewed supportive care and red flags that should prompt return.  Pt expressed understanding and is in agreement w/ plan.

## 2016-10-30 NOTE — Progress Notes (Signed)
Pre visit review using our clinic review tool, if applicable. No additional management support is needed unless otherwise documented below in the visit note. 

## 2016-10-30 NOTE — Assessment & Plan Note (Signed)
Pt has gained 16 lbs since last visit.  Stressed need for healthy diet and regular exercise.  Check labs to risk stratify.  Will follow.

## 2016-10-30 NOTE — Assessment & Plan Note (Signed)
Ongoing issue for pt.  sxs worsened after Pristiq went generic- she feels the medication is not as strong.  Pt reports when she takes 2 tabs (100mg ) her sxs are better.  Will increase dose and monitor for improvement.  Pt expressed understanding and is in agreement w/ plan.

## 2016-10-30 NOTE — Patient Instructions (Signed)
Follow up in 6 months to recheck ADHD Please follow up by phone or MyChart to let me know how the increased dose of Pristiq is working Start the [employer] 100mg  daily Continue the Adderall Try and make healthy food choices and get regular exercise Drink plenty of fluids REST!  Allow yourself time to recover Call with any questions or concerns Hang in there!!!

## 2016-10-30 NOTE — Progress Notes (Signed)
   Subjective:    Patient ID: Laurine Blazer, female    DOB: 14-Jan-1971, 46 y.o.   MRN: [id number]  HPI ADHD- chronic problem.  Pt feels sxs are well controlled on the XR 25mg  daily and then the 10mg  in the afternoon as needed.  No palpitations, insomnia.  + loss of appetite- will drink protein shakes during the day and then eat dinner at night.  Depression/anxiety- pt has lost her son and her father.  sxs are worse w/ menses.  Pt reports since Pristiq went generic, 'it's not what it was'.  Pt has been taking 2 and sxs improve.  Sinus inxfxn- started Augmentin on 1/31.  Pt reports feeling worse rather than better.  States sinus pressure has improved but 'i'm just really weak, no energy'.  + HA.  Yesterday had chills, no known fever.  No body aches.  'yesterday I just couldn't even get out of bed'.   Review of Systems For ROS see HPI     Objective:   Physical Exam  Constitutional: She is oriented to person, place, and time. She appears well-developed and well-nourished. No distress.  obese  HENT:  Head: Normocephalic and atraumatic.  Mouth/Throat: Oropharynx is clear and moist. No oropharyngeal exudate.  TMs WNL bilaterally No TTP over sinuses  Eyes: Conjunctivae and EOM are normal. Pupils are equal, round, and reactive to light.  Neck: Normal range of motion. Neck supple. No thyromegaly present.  Cardiovascular: Normal rate, regular rhythm, normal heart sounds and intact distal pulses.   No murmur heard. Pulmonary/Chest: Effort normal and breath sounds normal. No respiratory distress.  Abdominal: Soft. She exhibits no distension. There is no tenderness.  Musculoskeletal: She exhibits no edema.  Lymphadenopathy:    She has no cervical adenopathy.  Neurological: She is alert and oriented to person, place, and time.  Skin: Skin is warm and dry.  Psychiatric: She has a normal mood and affect. Her behavior is normal.  Vitals reviewed.         Assessment & Plan:

## 2016-10-31 LAB — HEPATIC FUNCTION PANEL
ALT: 12 U/L (ref 6–29)
AST: 26 U/L (ref 10–30)
Albumin: 4.1 g/dL (ref 3.6–5.1)
Alkaline Phosphatase: 50 U/L (ref 33–115)
Bilirubin, Direct: 0.1 mg/dL
Indirect Bilirubin: 0.2 mg/dL (ref 0.2–1.2)
Total Bilirubin: 0.3 mg/dL (ref 0.2–1.2)
Total Protein: 6.9 g/dL (ref 6.1–8.1)

## 2016-10-31 LAB — CBC WITH DIFFERENTIAL/PLATELET
BASOS ABS: 63 {cells}/uL (ref 0–200)
Basophils Relative: 1 %
EOS ABS: 63 {cells}/uL (ref 15–500)
EOS PCT: 1 %
HCT: 42.7 % (ref 35.0–45.0)
HEMOGLOBIN: 14.3 g/dL (ref 11.7–15.5)
LYMPHS ABS: 1764 {cells}/uL (ref 850–3900)
Lymphocytes Relative: 28 %
MCH: 31.8 pg (ref 27.0–33.0)
MCHC: 33.5 g/dL (ref 32.0–36.0)
MCV: 95.1 fL (ref 80.0–100.0)
MPV: 8.9 fL (ref 7.5–12.5)
Monocytes Absolute: 756 cells/uL (ref 200–950)
Monocytes Relative: 12 %
NEUTROS ABS: 3654 {cells}/uL (ref 1500–7800)
NEUTROS PCT: 58 %
Platelets: 270 10*3/uL (ref 140–400)
RBC: 4.49 MIL/uL (ref 3.80–5.10)
RDW: 13.7 % (ref 11.0–15.0)
WBC: 6.3 10*3/uL (ref 3.8–10.8)

## 2016-10-31 LAB — BASIC METABOLIC PANEL WITH GFR
BUN: 19 mg/dL (ref 7–25)
CO2: 26 mmol/L (ref 20–31)
Calcium: 9.1 mg/dL (ref 8.6–10.2)
Chloride: 105 mmol/L (ref 98–110)
Creat: 0.92 mg/dL (ref 0.50–1.10)
Glucose, Bld: 90 mg/dL (ref 65–99)
Potassium: 5.1 mmol/L (ref 3.5–5.3)
Sodium: 138 mmol/L (ref 135–146)

## 2016-10-31 LAB — TSH: TSH: 0.38 m[IU]/L — AB

## 2016-11-03 NOTE — Progress Notes (Signed)
Called pt and lmovm to return call.

## 2016-11-04 ENCOUNTER — Encounter: Payer: Self-pay | Admitting: General Practice

## 2016-11-04 ENCOUNTER — Telehealth: Payer: Self-pay | Admitting: Family Medicine

## 2016-11-04 NOTE — Telephone Encounter (Signed)
Noted, will call pt back tomorrow as I am out of the office before 2pm today.

## 2016-11-04 NOTE — Telephone Encounter (Signed)
Patient returning call about results. Please call back after 2pm if it's after 11:10am.   Thank you.

## 2016-11-05 ENCOUNTER — Other Ambulatory Visit: Payer: Self-pay | Admitting: Family Medicine

## 2016-11-05 DIAGNOSIS — R7989 Other specified abnormal findings of blood chemistry: Secondary | ICD-10-CM

## 2016-11-05 NOTE — Telephone Encounter (Signed)
Pt notified of labs, TSh repeat ordered, pt will call back to schedule.

## 2016-11-14 ENCOUNTER — Other Ambulatory Visit: Payer: Self-pay | Admitting: Family Medicine

## 2016-11-17 NOTE — Telephone Encounter (Signed)
Medication filled to pharmacy as requested.   

## 2016-11-17 NOTE — Telephone Encounter (Signed)
Last OV 10/30/16 ambien last filled 10/15/16 #30 with 0

## 2017-01-02 ENCOUNTER — Other Ambulatory Visit: Payer: Self-pay | Admitting: Family Medicine

## 2017-01-22 ENCOUNTER — Telehealth: Payer: Self-pay | Admitting: Family Medicine

## 2017-01-22 DIAGNOSIS — F339 Major depressive disorder, recurrent, unspecified: Secondary | ICD-10-CM

## 2017-01-22 NOTE — Telephone Encounter (Signed)
Patient states she switched to generic Pristiq when it became available due to cost.  She states the generic does not work as well as the branded and would like to switch back to Pristiq 50mg .  [facility] to office so she does not have to pay $290 for this branded Pristiq.   Patient requesting call back to be notified when prior authorization has been submitted.

## 2017-01-23 MED ORDER — PRISTIQ 100 MG PO TB24: 100.0000 mg | ORAL_TABLET | Freq: Every day | ORAL | 1 refills | Status: DC

## 2017-01-23 NOTE — Telephone Encounter (Signed)
Form has been received and filled out.    I have faxed paper to the number listed on the paperwork.   Patient is aware.   Once we have an approval from [employer], patient will need a [facility].

## 2017-01-23 NOTE — Telephone Encounter (Signed)
Noted! Thank you

## 2017-01-23 NOTE — Telephone Encounter (Signed)
Medication filled to pharmacy.  Patient aware and appreciative of the help getting this done.

## 2017-01-23 NOTE — Telephone Encounter (Signed)
Yes.  Thank you.

## 2017-01-23 NOTE — Addendum Note (Signed)
Addended by: Katina Dung on: 01/23/2017 01:42 PM   Modules accepted: Orders

## 2017-01-23 NOTE — Telephone Encounter (Signed)
Medication has been approved through insurance  Approval Dates: 01/23/2017-01/24/2020   Okay to fill new RX?

## 2017-01-27 ENCOUNTER — Other Ambulatory Visit: Payer: Self-pay | Admitting: *Deleted

## 2017-01-27 DIAGNOSIS — F339 Major depressive disorder, recurrent, unspecified: Secondary | ICD-10-CM

## 2017-01-27 MED ORDER — PRISTIQ 50 MG PO TB24: 50.0000 mg | ORAL_TABLET | Freq: Every day | ORAL | 3 refills | Status: DC

## 2017-01-27 NOTE — Progress Notes (Signed)
Order was supposed to be 50 MG of Pristiq brand, not the 100mg  generic.  Order corrected and sent to pharmacy.

## 2017-03-13 ENCOUNTER — Other Ambulatory Visit: Payer: Self-pay | Admitting: Family Medicine

## 2017-03-13 DIAGNOSIS — F988 Other specified behavioral and emotional disorders with onset usually occurring in childhood and adolescence: Secondary | ICD-10-CM

## 2017-03-13 MED ORDER — AMPHETAMINE-DEXTROAMPHET ER 25 MG PO CP24: 25.0000 mg | ORAL_CAPSULE | ORAL | 0 refills | Status: DC

## 2017-03-13 MED ORDER — AMPHETAMINE-DEXTROAMPHETAMINE 10 MG PO TABS: 10.0000 mg | ORAL_TABLET | Freq: Every day | ORAL | 0 refills | Status: DC

## 2017-03-13 NOTE — Telephone Encounter (Signed)
Called and left a detailed message to inform pt of PCP recommendations and that rx is at the front desk for pick up.

## 2017-03-13 NOTE — Telephone Encounter (Signed)
Last OV 10/30/16 adderalls last filled 10/30/16 #90 with 0

## 2017-03-13 NOTE — Telephone Encounter (Signed)
Ok to refill both for #90.  Please let her know that our office does not have online prescribing ability for ADHD meds.  It is coming in the next few years but the price tag on this capability is very high and our group does not yet have this

## 2017-03-13 NOTE — Telephone Encounter (Signed)
Pt asking for a refill on both of her adderall's, pt had stated that a friend of her (that go's to another doctors office) has her adderall called into the pharmacy and asking if that is something that we could start doing for her.

## 2017-04-02 ENCOUNTER — Other Ambulatory Visit: Payer: Self-pay | Admitting: Family Medicine

## 2017-04-02 NOTE — Telephone Encounter (Signed)
Medication filled to pharmacy as requested.   

## 2017-04-02 NOTE — Telephone Encounter (Signed)
Last OV 10/30/16 Zolpidem last filled 11/17/16 #30 with 3

## 2017-04-13 ENCOUNTER — Other Ambulatory Visit: Payer: Self-pay | Admitting: Family Medicine

## 2017-06-17 ENCOUNTER — Telehealth: Payer: Self-pay | Admitting: Family Medicine

## 2017-06-17 DIAGNOSIS — F988 Other specified behavioral and emotional disorders with onset usually occurring in childhood and adolescence: Secondary | ICD-10-CM

## 2017-06-17 MED ORDER — AMPHETAMINE-DEXTROAMPHETAMINE 10 MG PO TABS: 10.0000 mg | ORAL_TABLET | Freq: Every day | ORAL | 0 refills | Status: DC

## 2017-06-17 MED ORDER — AMPHETAMINE-DEXTROAMPHET ER 25 MG PO CP24: 25.0000 mg | ORAL_CAPSULE | ORAL | 0 refills | Status: DC

## 2017-06-17 NOTE — Telephone Encounter (Signed)
Patient requesting refill of the following:  amphetamine-dextroamphetamine (ADDERALL) 10 MG tablet  amphetamine-dextroamphetamine (ADDERALL XR) 25 MG 24 hr capsule

## 2017-06-17 NOTE — Telephone Encounter (Signed)
Last OV 10/30/16 adderall 25mg  last filled 03/13/17 #90 with 0 adderall 10mg  last filled 03/13/17 #90 with 0

## 2017-06-17 NOTE — Telephone Encounter (Signed)
Kristi Hill for 30 of each but she is overdue for CPE or at least ADHD follow up appt

## 2017-06-17 NOTE — Telephone Encounter (Signed)
Noted will advise pt. Rx printed for PCP signature.

## 2017-07-16 ENCOUNTER — Ambulatory Visit: Payer: Managed Care, Other (non HMO) | Admitting: Family Medicine

## 2017-07-22 ENCOUNTER — Ambulatory Visit (INDEPENDENT_AMBULATORY_CARE_PROVIDER_SITE_OTHER): Payer: Managed Care, Other (non HMO) | Admitting: Family Medicine

## 2017-07-22 ENCOUNTER — Encounter: Payer: Self-pay | Admitting: Family Medicine

## 2017-07-22 VITALS — BP 124/80 | HR 103 | Resp 16 | Ht 69.0 in | Wt 244.0 lb

## 2017-07-22 DIAGNOSIS — F988 Other specified behavioral and emotional disorders with onset usually occurring in childhood and adolescence: Secondary | ICD-10-CM | POA: Diagnosis not present

## 2017-07-22 DIAGNOSIS — F329 Major depressive disorder, single episode, unspecified: Secondary | ICD-10-CM | POA: Diagnosis not present

## 2017-07-22 DIAGNOSIS — F32A Depression, unspecified: Secondary | ICD-10-CM

## 2017-07-22 DIAGNOSIS — E669 Obesity, unspecified: Secondary | ICD-10-CM

## 2017-07-22 MED ORDER — AMPHETAMINE-DEXTROAMPHETAMINE 10 MG PO TABS: 10.0000 mg | ORAL_TABLET | Freq: Every day | ORAL | 0 refills | Status: DC

## 2017-07-22 MED ORDER — AMPHETAMINE-DEXTROAMPHET ER 25 MG PO CP24: 25.0000 mg | ORAL_CAPSULE | ORAL | 0 refills | Status: DC

## 2017-07-22 MED ORDER — PRISTIQ 50 MG PO TB24: 50.0000 mg | ORAL_TABLET | Freq: Every day | ORAL | 1 refills | Status: DC

## 2017-07-22 NOTE — Assessment & Plan Note (Signed)
Deteriorated.  Pt has gained 20 lbs.  Stressed need for healthy diet and regular exercise.  Will continue to follow.

## 2017-07-22 NOTE — Progress Notes (Signed)
   Subjective:    Patient ID: Kristi Hill, female    DOB: 10/03/1970, 46 y.o.   MRN: [id number]  HPI ADHD- chronic problem. Doing well on current dose of Adderall XR 25mg  and short acting 10mg  PRN.  Denies insomnia, palpitations, elevated BP.  Obesity- pt's BMI is now 68.  She has gained 20 lbs since last visit.  Pt is not following a particular diet or getting regular exercise.  Anxiety/depression- pt feels she is in a different place than she was previously and the pharmacy gave her 100mg  dose rather than 50mg  that we wrote for.  She feels current dose is too high and causing her to be very flat.  Is attempting to wean off Ambien.   Review of Systems For ROS see HPI     Objective:   Physical Exam  Constitutional: She is oriented to person, place, and time. She appears well-developed and well-nourished. No distress.  obese  HENT:  Head: Normocephalic and atraumatic.  Eyes: Pupils are equal, round, and reactive to light. Conjunctivae and EOM are normal.  Neck: Normal range of motion. Neck supple. No thyromegaly present.  Cardiovascular: Normal rate, regular rhythm, normal heart sounds and intact distal pulses.   No murmur heard. Pulmonary/Chest: Effort normal and breath sounds normal. No respiratory distress.  Abdominal: Soft. She exhibits no distension. There is no tenderness.  Musculoskeletal: She exhibits no edema.  Lymphadenopathy:    She has no cervical adenopathy.  Neurological: She is alert and oriented to person, place, and time.  Skin: Skin is warm and dry.  Psychiatric: She has a normal mood and affect. Her behavior is normal.  Vitals reviewed.         Assessment & Plan:

## 2017-07-22 NOTE — Assessment & Plan Note (Signed)
Improved.  Pt wants to decrease Pristiq to 50mg  (the dose she is supposed to be on) b/c she feels the higher dose is making her 'flat.  Like a zombie'.  Decrease dose and monitor for improvement.  Pt expressed understanding and is in agreement w/ plan.

## 2017-07-22 NOTE — Assessment & Plan Note (Signed)
Well controlled on current meds.  Refills provided.  Will follow.

## 2017-07-22 NOTE — Patient Instructions (Signed)
Schedule your complete physical in February Continue to work on [employer] and regular exercise- you can do it!!! Decrease the Pristiq to 50mg  daily (new prescription sent) Call with any questions or concerns Happy Fall!!!

## 2017-07-27 ENCOUNTER — Telehealth: Payer: Self-pay | Admitting: Family Medicine

## 2017-07-27 MED ORDER — FLUCONAZOLE 150 MG PO TABS: 150.0000 mg | ORAL_TABLET | Freq: Once | ORAL | 0 refills | Status: AC

## 2017-07-27 NOTE — Telephone Encounter (Signed)
Pt states that she has gotten a yeast infection from abx that dentist gave her and asking that something be [facility] [address], pt states that dentist office is closed today and pt is leaving to go out of town for daughters softball tournament on weds.

## 2017-07-27 NOTE — Telephone Encounter (Signed)
Ok for Diflucan 150mg x1. 

## 2017-07-27 NOTE — Telephone Encounter (Signed)
Please advise 

## 2017-07-27 NOTE — Telephone Encounter (Signed)
Pt informed med filled.

## 2017-08-04 ENCOUNTER — Other Ambulatory Visit: Payer: Self-pay | Admitting: Family Medicine

## 2017-08-04 DIAGNOSIS — F339 Major depressive disorder, recurrent, unspecified: Secondary | ICD-10-CM

## 2017-08-15 ENCOUNTER — Other Ambulatory Visit: Payer: Self-pay | Admitting: Family Medicine

## 2017-08-17 NOTE — Telephone Encounter (Signed)
Last OV 07/22/17 Zolpidem last filled 04/02/17 #30 with 1

## 2017-08-17 NOTE — Telephone Encounter (Signed)
Medication filled to pharmacy as requested.    [place] for [facility] to Discuss results / PCP recommendations / Schedule patient.

## 2017-08-20 ENCOUNTER — Telehealth: Payer: Self-pay | Admitting: Family Medicine

## 2017-08-20 NOTE — Telephone Encounter (Signed)
Requesting adderall

## 2017-08-20 NOTE — Telephone Encounter (Signed)
Copied from [place]. Topic: Quick Communication - Rx Refill/Question >> Aug 20, 2017 10:52 AM Patrice Paradise wrote: Patient is requesting for insurance purpose that Dr. Birdie Riddle rewrite her prescriptions for Adderall XR 25 mg for 90 days and Adderall 10 mg for 90 days. Patient would like a call back once the prescripitons are ready to be picked up. Pt number     Has the patient contacted their pharmacy? yes  (Agent: If no, request that the patient contact the pharmacy for the refill.) Preferred Pharmacy (with phone number or street name): [id number] [address], [place] [address] Phone: [phone] Fax: [phone]  Agent: Please be advised that RX refills may take up to 48 hours. We ask that you follow-up with your pharmacy.

## 2017-08-21 ENCOUNTER — Other Ambulatory Visit: Payer: Self-pay | Admitting: General Practice

## 2017-08-21 DIAGNOSIS — F988 Other specified behavioral and emotional disorders with onset usually occurring in childhood and adolescence: Secondary | ICD-10-CM

## 2017-08-21 MED ORDER — AMPHETAMINE-DEXTROAMPHETAMINE 10 MG PO TABS: 10.0000 mg | ORAL_TABLET | Freq: Every day | ORAL | 0 refills | Status: DC

## 2017-08-21 MED ORDER — AMPHETAMINE-DEXTROAMPHET ER 25 MG PO CP24
25.0000 mg | ORAL_CAPSULE | ORAL | 0 refills | Status: DC
Start: 2017-08-21 — End: 2017-10-08

## 2017-08-25 LAB — HM MAMMOGRAPHY: HM Mammogram: NORMAL (ref 0–4)

## 2017-10-08 ENCOUNTER — Other Ambulatory Visit: Payer: Self-pay | Admitting: Family Medicine

## 2017-10-08 MED ORDER — AMPHETAMINE-DEXTROAMPHET ER 25 MG PO CP24: 25.0000 mg | ORAL_CAPSULE | ORAL | 0 refills | Status: DC

## 2017-10-08 NOTE — Telephone Encounter (Signed)
Should have over a month of medication remaining.  No refill at this time

## 2017-10-08 NOTE — Telephone Encounter (Signed)
Called pt and advised on VM that per pharmacy insurance will only fill #30 of the 25mg  for 1 month. #30 was filled to the pharmacy today.

## 2017-10-08 NOTE — Telephone Encounter (Signed)
Copied from [place]. Topic: Quick Communication - See Telephone Encounter >> Oct 08, 2017 10:28 AM Ether Griffins B wrote: CRM for notification. See Telephone encounter for:  Pt needing refill on adderall 25mg  needs a 90 day supply last one was a 30 day supply.  10/08/17.

## 2017-10-08 NOTE — Telephone Encounter (Signed)
Reviewed controlled substance database which shows she picked up #90 of the 10mg  Adderall but only #30 of the 25mg  Adderall.  Please call pharmacy and see if she has any pills remaining on the 90 day script (as this is what we sent in).  If not, we can refill the 25mg , #90, no refills.

## 2017-10-08 NOTE — Addendum Note (Signed)
Addended by: Davis Gourd on: 10/08/2017 01:41 PM   Modules accepted: Orders

## 2017-10-08 NOTE — Telephone Encounter (Signed)
Last OV 07/22/17 adderall last filled 08/21/17 #90 with 0  Please advise, our records show last Rx was for #90

## 2017-10-29 ENCOUNTER — Telehealth: Payer: Self-pay | Admitting: Family Medicine

## 2017-10-29 DIAGNOSIS — F339 Major depressive disorder, recurrent, unspecified: Secondary | ICD-10-CM

## 2017-10-29 MED ORDER — PRISTIQ 100 MG PO TB24
100.0000 mg | ORAL_TABLET | Freq: Every day | ORAL | 1 refills | Status: DC
Start: 2017-10-29 — End: 2018-01-28

## 2017-10-29 NOTE — Telephone Encounter (Signed)
Name brand filled. Will wait to see if PA is populated for PCP to sign off on

## 2017-10-29 NOTE — Telephone Encounter (Signed)
Copied from [place]. Topic: Quick Communication - See Telephone Encounter >> Oct 29, 2017  3:51 PM Vernona Rieger wrote: CRM for notification. See Telephone encounter for:   10/29/17.   [facility] [address] [facility] for PRISTIQ 50 MG 24 hr tablet (they need the actual brand) she said Dr Birdie Riddle has to send a letter to her insurance company so they can approve it & let them know she tried the generic. The generic is to expensive. She said she needs a 90 day supply

## 2017-10-29 NOTE — Addendum Note (Signed)
Addended by: Davis Gourd on: 10/29/2017 05:02 PM   Modules accepted: Orders

## 2017-11-10 ENCOUNTER — Telehealth: Payer: Self-pay | Admitting: Family Medicine

## 2017-11-10 ENCOUNTER — Other Ambulatory Visit: Payer: Self-pay | Admitting: Family Medicine

## 2017-11-10 NOTE — Telephone Encounter (Signed)
Copied from [place]. Topic: Quick Communication - Rx Refill/Question >> Nov 10, 2017  2:47 PM Scherrie Gerlach wrote: Medication: zolpidem (AMBIEN) 10 MG tablet  Pt is requesting the dr send letter to the insurance company to release her 30 tabs instead of only 15 tabs.  Pt states due to the new year, this has to be done every year.

## 2017-11-10 NOTE — Telephone Encounter (Signed)
Copied from [place]. Topic: Quick Communication - Rx Refill/Question >> Nov 10, 2017  2:44 PM Scherrie Gerlach wrote: Medication: amphetamine-dextroamphetamine (ADDERALL XR) 25 MG 24 hr capsule**  Has the patient contacted their pharmacy? yes  Pt is requesting the dr call the pharmacy so they will release her last 30 days of a 90 day script Pt states this happened last month as well   [id number] [address], [place] [phone] (Phone) [phone] (Fax)

## 2017-11-10 NOTE — Telephone Encounter (Signed)
Ok to release last 30 days of medication.  We will no longer prescribe 90 day supply of this medication to avoid future confusion

## 2017-11-11 MED ORDER — AMPHETAMINE-DEXTROAMPHET ER 25 MG PO CP24: 25.0000 mg | ORAL_CAPSULE | ORAL | 0 refills | Status: DC

## 2017-11-11 NOTE — Telephone Encounter (Signed)
Ok to authorize release of last 30 pills

## 2017-11-11 NOTE — Telephone Encounter (Signed)
Called pharmacy and was advised that pt needs a new prescription  Last OV 07/22/17 adderall 25mg  last filled1/17/19 #30 with 0

## 2017-11-11 NOTE — Telephone Encounter (Signed)
Please advise 

## 2017-12-14 MED ORDER — ZOLPIDEM TARTRATE 10 MG PO TABS: ORAL_TABLET | ORAL | 1 refills | Status: DC

## 2017-12-14 MED ORDER — AMPHETAMINE-DEXTROAMPHET ER 25 MG PO CP24: 25.0000 mg | ORAL_CAPSULE | ORAL | 0 refills | Status: DC

## 2017-12-14 NOTE — Telephone Encounter (Signed)
I removed the #15 max note

## 2017-12-14 NOTE — Telephone Encounter (Signed)
Please advise? The rx is written for #30, however there is a note on there that states max 15. [place] for #30

## 2017-12-14 NOTE — Addendum Note (Signed)
Addended by: Davis Gourd on: 12/14/2017 04:38 PM   Modules accepted: Orders

## 2017-12-14 NOTE — Addendum Note (Signed)
Addended by: Davis Gourd on: 12/14/2017 04:47 PM   Modules accepted: Orders

## 2017-12-14 NOTE — Telephone Encounter (Signed)
Pt states she p/u RX in Feb but it was only dispensed for #15. She said that we were obtaining approval for #30 ambien per month but new RX needs sent to pharmacy.   Pt also needing refill on both adderall prescriptions. Pt is scheduled for CPE 7/11 2:30pm (first available). Please advise.  [id number] [address], [place] [phone] (Phone) [phone] (Fax)

## 2018-01-13 ENCOUNTER — Other Ambulatory Visit: Payer: Self-pay | Admitting: Family Medicine

## 2018-01-13 DIAGNOSIS — F988 Other specified behavioral and emotional disorders with onset usually occurring in childhood and adolescence: Secondary | ICD-10-CM

## 2018-01-13 NOTE — Telephone Encounter (Signed)
Copied from [place]. Topic: Quick Communication - Rx Refill/Question >> Jan 13, 2018  3:34 PM Synthia Innocent wrote: Medication: amphetamine-dextroamphetamine (ADDERALL XR) 25 MG 24 hr capsule and amphetamine-dextroamphetamine (ADDERALL) 10 MG tablet  Has the patient contacted their pharmacy? Yes.   (Agent: If no, request that the patient contact the pharmacy for the refill.) Preferred Pharmacy (with phone number [facility] [address] Agent: Please be advised that RX refills may take up to 3 business days. We ask that you follow-up with your pharmacy.

## 2018-01-14 NOTE — Telephone Encounter (Signed)
Adderall XR 25mg  24 hr refill request  Adderall 10 mg refill request  LOV 07/22/17 with Dr. Birdie Riddle  Last Refill:  25  Mg - 08/21/17  #90                     10 mg 12/14/17  #30  [id number] [place] [place].

## 2018-01-15 NOTE — Telephone Encounter (Signed)
Last OV 6 months ago. No appt scheduled until July.  Will leave for Dr.Tabori to refill or if needs office visit.

## 2018-01-15 NOTE — Telephone Encounter (Signed)
Last oV: 06/2017 Fill Date: 12/14/2017

## 2018-01-15 NOTE — Telephone Encounter (Signed)
Pt called in to follow up on refill request. Advised of current status. Pt expressed understanding.

## 2018-01-18 ENCOUNTER — Other Ambulatory Visit: Payer: Self-pay | Admitting: General Practice

## 2018-01-18 MED ORDER — AMPHETAMINE-DEXTROAMPHETAMINE 10 MG PO TABS: 10.0000 mg | ORAL_TABLET | Freq: Every day | ORAL | 0 refills | Status: DC

## 2018-01-18 MED ORDER — AMPHETAMINE-DEXTROAMPHET ER 25 MG PO CP24: 25.0000 mg | ORAL_CAPSULE | ORAL | 0 refills | Status: DC

## 2018-01-18 NOTE — Telephone Encounter (Signed)
Will inform pt rx is available for pick up at front desk.

## 2018-01-18 NOTE — Addendum Note (Signed)
Addended by: Davis Gourd on: 01/18/2018 10:42 AM   Modules accepted: Orders

## 2018-01-28 ENCOUNTER — Other Ambulatory Visit: Payer: Self-pay | Admitting: Family Medicine

## 2018-02-19 ENCOUNTER — Other Ambulatory Visit: Payer: Self-pay | Admitting: Family Medicine

## 2018-02-19 MED ORDER — AMPHETAMINE-DEXTROAMPHET ER 25 MG PO CP24: 25.0000 mg | ORAL_CAPSULE | ORAL | 0 refills | Status: DC

## 2018-02-19 NOTE — Telephone Encounter (Signed)
Copied from [place] [phone]. Topic: Quick Communication - Rx Refill/Question >> Feb 19, 2018  3:11 PM Carolyn Stare wrote: Medication    amphetamine-dextroamphetamine (ADDERALL XR) 25 MG 24 hr capsule   [facility]acy   [facility] [place]    Agent: Please be advised that RX refills may take up to 3 business days. We ask that you follow-up with your pharmacy.

## 2018-02-19 NOTE — Telephone Encounter (Signed)
Last Filled: 01/18/18 #30, 0 Last OV: 07/22/17

## 2018-02-19 NOTE — Telephone Encounter (Deleted)
LOV: 07/22/17 Fill Date:

## 2018-03-24 ENCOUNTER — Other Ambulatory Visit: Payer: Self-pay | Admitting: Family Medicine

## 2018-03-24 MED ORDER — AMPHETAMINE-DEXTROAMPHET ER 25 MG PO CP24: 25.0000 mg | ORAL_CAPSULE | ORAL | 0 refills | Status: DC

## 2018-03-24 NOTE — Telephone Encounter (Signed)
Copied from [place] [phone]. Topic: Quick Communication - Rx Refill/Question >> Mar 24, 2018  1:24 PM Stovall, Shana A wrote: Medication: amphetamine-dextroamphetamine (ADDERALL XR) 25 MG 24 hr capsule [[id number]]   Has the patient contacted their pharmacy? No  (Agent: If no, request that the patient contact the pharmacy for the refill.) (Agent: If yes, when and what did the pharmacy advise?)  [facility]erred [facility]with phone number [facility] [address]   Agent: Please be advised that RX refills may take up to 3 business days. We ask that you follow-up with your pharmacy.

## 2018-03-24 NOTE — Telephone Encounter (Signed)
Adderall 25mg  24hr refill Last Refill:02/19/18 #30 Last OV: 07/22/17 Next OV: 04/01/18 PCP: Dr. Birdie Riddle [facility] [address]

## 2018-04-01 ENCOUNTER — Encounter: Payer: Managed Care, Other (non HMO) | Admitting: Family Medicine

## 2018-04-08 ENCOUNTER — Other Ambulatory Visit: Payer: Self-pay | Admitting: Family Medicine

## 2018-04-08 NOTE — Telephone Encounter (Signed)
Last OV 07/22/17 Zolpidem last filled 12/14/17 #30 with 1

## 2018-04-21 ENCOUNTER — Other Ambulatory Visit: Payer: Self-pay | Admitting: Family Medicine

## 2018-04-21 DIAGNOSIS — F988 Other specified behavioral and emotional disorders with onset usually occurring in childhood and adolescence: Secondary | ICD-10-CM

## 2018-04-21 NOTE — Telephone Encounter (Signed)
Copied from [place] [id number]. Topic: Quick Communication - Rx Refill/Question >> Apr 21, 2018 12:03 PM Lennox Solders wrote: Medication: generic adderall 10 mg #90 Has the patient contacted their pharmacy?yes (Agent: If no, request that the patient contact the pharmacy for the refill.) (Agent: If yes, when and what did the pharmacy advise?) pt said pharm told her to call her md  Preferred Pharmacy (with phone number or street name): [facility] [address] [facility] [place]  Agent: Please be advised that RX refills may take up to 3 business days. We ask that you follow-up with your pharmacy.

## 2018-04-21 NOTE — Telephone Encounter (Signed)
Last OV 07/22/17 adderall 10mg  last filled 01/18/18 #90 with 0

## 2018-04-22 NOTE — Telephone Encounter (Signed)
Called pt and left a detailed message to advise of Dr. Tamela Oddi notes. Ok to schedule pt with any provider in our office.   [place] for [facility] to Discuss results / PCP recommendations / Schedule patient.

## 2018-04-22 NOTE — Telephone Encounter (Signed)
Declined. Needs ov.  Last ov 06/2017

## 2018-04-26 NOTE — Telephone Encounter (Signed)
Closing encounter until pt returns call.

## 2018-04-29 MED ORDER — AMPHETAMINE-DEXTROAMPHETAMINE 10 MG PO TABS: 10.0000 mg | ORAL_TABLET | Freq: Every day | ORAL | 0 refills | Status: DC

## 2018-04-29 NOTE — Telephone Encounter (Signed)
Please see message. °

## 2018-04-29 NOTE — Telephone Encounter (Signed)
[place] for #30, no refills of Adderall (this will be last refill w/o appt)

## 2018-04-29 NOTE — Addendum Note (Signed)
Addended by: Davis Gourd on: 04/29/2018 04:29 PM   Modules accepted: Orders

## 2018-04-29 NOTE — Telephone Encounter (Signed)
Patient called and states can Dr .Birdie Riddle send in a weeks worth of her Adderall until her next appt which is 05/10/18. She states she is going to be out tomorrow .Please advise

## 2018-04-29 NOTE — Telephone Encounter (Signed)
Medication filled to pharmacy as requested.   

## 2018-05-10 ENCOUNTER — Ambulatory Visit: Payer: Managed Care, Other (non HMO) | Admitting: Family Medicine

## 2018-05-13 ENCOUNTER — Other Ambulatory Visit: Payer: Self-pay

## 2018-05-13 ENCOUNTER — Encounter: Payer: Self-pay | Admitting: Family Medicine

## 2018-05-13 ENCOUNTER — Ambulatory Visit: Payer: Managed Care, Other (non HMO) | Admitting: Family Medicine

## 2018-05-13 VITALS — BP 120/82 | HR 86 | Temp 98.8°F | Resp 16 | Ht 67.75 in | Wt 250.2 lb

## 2018-05-13 DIAGNOSIS — F32A Depression, unspecified: Secondary | ICD-10-CM

## 2018-05-13 DIAGNOSIS — F329 Major depressive disorder, single episode, unspecified: Secondary | ICD-10-CM | POA: Diagnosis not present

## 2018-05-13 DIAGNOSIS — F988 Other specified behavioral and emotional disorders with onset usually occurring in childhood and adolescence: Secondary | ICD-10-CM | POA: Diagnosis not present

## 2018-05-13 MED ORDER — AMPHETAMINE-DEXTROAMPHETAMINE 10 MG PO TABS: 10.0000 mg | ORAL_TABLET | Freq: Every day | ORAL | 0 refills | Status: DC

## 2018-05-13 MED ORDER — AMPHETAMINE-DEXTROAMPHET ER 25 MG PO CP24: 25.0000 mg | ORAL_CAPSULE | ORAL | 0 refills | Status: DC

## 2018-05-13 NOTE — Patient Instructions (Signed)
Follow up as scheduled for your physical- sooner if needed If you find that the depression is worsening, let me know! Call with any questions or concerns I'm SO proud of you!!!

## 2018-05-13 NOTE — Progress Notes (Signed)
   Subjective:    Patient ID: Kristi Hill, female    DOB: [birth year], 47 y.o.   MRN: [id number]  HPI ADHD- chronic problem, well controlled on current dosage.  Takes 25mg  XR every morning and then the 10mg  as needed for additional afternoon control.  No palpitations or CP.  Denies medication related insomnia.  + decreased appetite.  Depression- chronic problem, on Pristiq 50mg  daily.  Pt has had a lot of stress and emotion w/ daughter graduating and going to college.  Pt wants to refrain from increasing dose and wants to try getting back in to regular exercise.  Pt's PHQ-9 was 12.  Review of Systems For ROS see HPI     Objective:   Physical Exam  Constitutional: She is oriented to person, place, and time. She appears well-developed and well-nourished. No distress.  HENT:  Head: Normocephalic and atraumatic.  Eyes: Pupils are equal, round, and reactive to light. Conjunctivae and EOM are normal.  Neck: Normal range of motion. Neck supple. No thyromegaly present.  Cardiovascular: Normal rate, regular rhythm, normal heart sounds and intact distal pulses.  No murmur heard. Pulmonary/Chest: Effort normal and breath sounds normal. No respiratory distress.  Abdominal: Soft. She exhibits no distension. There is no tenderness.  Musculoskeletal: She exhibits no edema.  Lymphadenopathy:    She has no cervical adenopathy.  Neurological: She is alert and oriented to person, place, and time.  Skin: Skin is warm and dry.  Psychiatric: She has a normal mood and affect. Her behavior is normal.  Vitals reviewed.         Assessment & Plan:

## 2018-05-13 NOTE — Assessment & Plan Note (Signed)
Chronic problem.  Currently well controlled.  [facility] updated and prescriptions filled today.

## 2018-05-13 NOTE — Assessment & Plan Note (Signed)
Deteriorated.  Pt lost her son to suicide in college and her daughter just moved into her freshman year at [employer].  She is hoping to control her depression w/ increased exercise and not increase her medication dose but she will let me know if she changes her mind.  I applauded her strength and courage in the face of this big, scary change.  Will follow closely.

## 2018-06-21 ENCOUNTER — Other Ambulatory Visit: Payer: Self-pay | Admitting: General Practice

## 2018-06-21 DIAGNOSIS — F988 Other specified behavioral and emotional disorders with onset usually occurring in childhood and adolescence: Secondary | ICD-10-CM

## 2018-06-21 NOTE — Telephone Encounter (Signed)
Last OV 05/13/18 adderall last filled 05/13/18 #30 with 0  Copied from [place] #[id number]. Topic: General - Other >> Jun 21, 2018  3:21 PM Mcneil, Ja-Kwan wrote: Reason for CRM: Pt states she needs a 90 day supply of the amphetamine-dextroamphetamine (ADDERALL XR) 25 MG 24 hr capsule [id number] [address], [place] [address] Phone: [phone]  Fax: [phone]

## 2018-06-22 NOTE — Telephone Encounter (Signed)
I prefer to fill 30 days at a time- ok to send 30

## 2018-06-22 NOTE — Telephone Encounter (Signed)
Medication was pended and sent to PCP for approval for #30

## 2018-06-23 ENCOUNTER — Other Ambulatory Visit: Payer: Self-pay | Admitting: Family Medicine

## 2018-06-23 MED ORDER — AMPHETAMINE-DEXTROAMPHET ER 25 MG PO CP24: 25.0000 mg | ORAL_CAPSULE | ORAL | 0 refills | Status: DC

## 2018-06-23 NOTE — Progress Notes (Signed)
Medication refilled at pt's request °

## 2018-07-20 ENCOUNTER — Other Ambulatory Visit: Payer: Self-pay | Admitting: Family Medicine

## 2018-07-20 DIAGNOSIS — F988 Other specified behavioral and emotional disorders with onset usually occurring in childhood and adolescence: Secondary | ICD-10-CM

## 2018-07-20 NOTE — Telephone Encounter (Signed)
Copied from [place] [id number]. Topic: Quick Communication - See Telephone Encounter >> Jul 20, 2018  4:42 PM Vernona Rieger wrote: CRM for notification. See Telephone encounter for: 07/20/18.  amphetamine-dextroamphetamine (ADDERALL) 10 MG tablet amphetamine-dextroamphetamine (ADDERALL XR) 25 MG 24 hr capsule  [id number] [place], [place] - [place] [id number]

## 2018-07-20 NOTE — Telephone Encounter (Signed)
Requested medication (s) are due for refill today: yes  Requested medication (s) are on the active medication list: yes    Last refill: 06/23/18...  25mg          05/13/18 ... 10 mg  Future visit scheduled yes  08/25/18  Dr. Birdie Riddle  Notes to clinic:not delegated  Requested Prescriptions  Pending Prescriptions Disp Refills   amphetamine-dextroamphetamine (ADDERALL XR) 25 MG 24 hr capsule 30 capsule 0    Sig: Take 1 capsule by mouth every morning.     Not Delegated - Psychiatry:  Stimulants/ADHD Failed - 07/20/2018  5:05 PM      Failed - This refill cannot be delegated      Failed - Urine Drug Screen completed in last 360 days.      Passed - Valid encounter within last 3 months    Recent Outpatient Visits          2 months ago Attention deficit disorder, unspecified hyperactivity presence   [place] Midge Minium, MD   12 months ago Depression, unspecified depression type   [facility] [place] [place] Midge Minium, MD   1 year ago Depression, unspecified depression type   [facility] [place] Midge Minium, MD   2 years ago Physical exam   [facility], MD   3 years ago Other fatigue   [facility] at [facility], MD      Future Appointments            In 1 month Tabori, Aundra Millet, MD [facility] [place], [facility]          amphetamine-dextroamphetamine (ADDERALL) 10 MG tablet 30 tablet 0    Sig: Take 1 tablet (10 mg total) by mouth daily.     Not Delegated - Psychiatry:  Stimulants/ADHD Failed - 07/20/2018  5:05 PM      Failed - This refill cannot be delegated      Failed - Urine Drug Screen completed in last 360 days.      Passed - Valid encounter within last 3 months    Recent Outpatient Visits          2 months ago  Attention deficit disorder, unspecified hyperactivity presence   [place] Midge Minium, MD   12 months ago Depression, unspecified depression type   [facility] [place] Midge Minium, MD   1 year ago Depression, unspecified depression type   [facility] [place] Midge Minium, MD   2 years ago Physical exam   [facility], MD   3 years ago Other fatigue   [facility] [place], MD      Future Appointments            In 1 month Tabori, Aundra Millet, MD [facility] [place], [facility]

## 2018-07-22 MED ORDER — AMPHETAMINE-DEXTROAMPHET ER 25 MG PO CP24: 25.0000 mg | ORAL_CAPSULE | ORAL | 0 refills | Status: DC

## 2018-07-22 MED ORDER — AMPHETAMINE-DEXTROAMPHETAMINE 10 MG PO TABS: 10.0000 mg | ORAL_TABLET | Freq: Every day | ORAL | 0 refills | Status: DC

## 2018-08-09 ENCOUNTER — Encounter: Payer: Managed Care, Other (non HMO) | Admitting: Family Medicine

## 2018-08-10 ENCOUNTER — Other Ambulatory Visit: Payer: Self-pay | Admitting: Family Medicine

## 2018-08-11 NOTE — Telephone Encounter (Signed)
Last OV 05/13/18 Zolpidem last filled 04/08/18 #15 with 3

## 2018-08-12 ENCOUNTER — Other Ambulatory Visit: Payer: Self-pay | Admitting: Family Medicine

## 2018-08-13 ENCOUNTER — Other Ambulatory Visit: Payer: Self-pay | Admitting: Family Medicine

## 2018-08-25 ENCOUNTER — Encounter: Payer: Self-pay | Admitting: Family Medicine

## 2018-08-25 ENCOUNTER — Ambulatory Visit (INDEPENDENT_AMBULATORY_CARE_PROVIDER_SITE_OTHER): Payer: Managed Care, Other (non HMO) | Admitting: Family Medicine

## 2018-08-25 ENCOUNTER — Other Ambulatory Visit: Payer: Self-pay

## 2018-08-25 VITALS — BP 118/81 | HR 84 | Temp 98.1°F | Resp 16 | Ht 68.0 in | Wt 243.1 lb

## 2018-08-25 DIAGNOSIS — E669 Obesity, unspecified: Secondary | ICD-10-CM | POA: Diagnosis not present

## 2018-08-25 DIAGNOSIS — Z0000 Encounter for general adult medical examination without abnormal findings: Secondary | ICD-10-CM | POA: Diagnosis not present

## 2018-08-25 DIAGNOSIS — F988 Other specified behavioral and emotional disorders with onset usually occurring in childhood and adolescence: Secondary | ICD-10-CM | POA: Diagnosis not present

## 2018-08-25 LAB — HEPATIC FUNCTION PANEL
ALBUMIN: 4.5 g/dL (ref 3.5–5.2)
ALT: 9 U/L (ref 0–35)
AST: 21 U/L (ref 0–37)
Alkaline Phosphatase: 44 U/L (ref 39–117)
Bilirubin, Direct: 0.1 mg/dL (ref 0.0–0.3)
Total Bilirubin: 0.5 mg/dL (ref 0.2–1.2)
Total Protein: 7.4 g/dL (ref 6.0–8.3)

## 2018-08-25 LAB — LIPID PANEL
CHOLESTEROL: 149 mg/dL (ref 0–200)
HDL: 54.4 mg/dL (ref >39.00)
LDL Cholesterol: 75 mg/dL (ref 0–99)
NonHDL: 94.81
TRIGLYCERIDES: 99 mg/dL (ref 0.0–149.0)
Total CHOL/HDL Ratio: 3
VLDL: 19.8 mg/dL (ref 0.0–40.0)

## 2018-08-25 LAB — BASIC METABOLIC PANEL
BUN: 21 mg/dL (ref 6–23)
CHLORIDE: 103 meq/L (ref 96–112)
CO2: 26 meq/L (ref 19–32)
Calcium: 9.1 mg/dL (ref 8.4–10.5)
Creatinine, Ser: 0.84 mg/dL (ref 0.40–1.20)
GFR: 77.73 mL/min (ref >60.00)
GLUCOSE: 81 mg/dL (ref 70–99)
POTASSIUM: 3.7 meq/L (ref 3.5–5.1)
Sodium: 138 mEq/L (ref 135–145)

## 2018-08-25 LAB — CBC WITH DIFFERENTIAL/PLATELET
Basophils Absolute: 0 10*3/uL (ref 0.0–0.1)
Basophils Relative: 0.7 % (ref 0.0–3.0)
Eosinophils Absolute: 0.1 10*3/uL (ref 0.0–0.7)
Eosinophils Relative: 1.7 % (ref 0.0–5.0)
HCT: 41.8 % (ref 36.0–46.0)
Hemoglobin: 14.2 g/dL (ref 12.0–15.0)
Lymphocytes Relative: 34.8 % (ref 12.0–46.0)
Lymphs Abs: 1.6 10*3/uL (ref 0.7–4.0)
MCHC: 34 g/dL (ref 30.0–36.0)
MCV: 94.8 fl (ref 78.0–100.0)
Monocytes Absolute: 0.5 10*3/uL (ref 0.1–1.0)
Monocytes Relative: 11.9 % (ref 3.0–12.0)
Neutro Abs: 2.3 10*3/uL (ref 1.4–7.7)
Neutrophils Relative %: 50.9 % (ref 43.0–77.0)
Platelets: 297 10*3/uL (ref 150.0–400.0)
RBC: 4.41 Mil/uL (ref 3.87–5.11)
RDW: 12.9 % (ref 11.5–15.5)
WBC: 4.5 10*3/uL (ref 4.0–10.5)

## 2018-08-25 LAB — TSH: TSH: 0.61 u[IU]/mL (ref 0.35–4.50)

## 2018-08-25 MED ORDER — AMPHETAMINE-DEXTROAMPHET ER 25 MG PO CP24: 25.0000 mg | ORAL_CAPSULE | ORAL | 0 refills | Status: DC

## 2018-08-25 MED ORDER — AMPHETAMINE-DEXTROAMPHETAMINE 10 MG PO TABS: 10.0000 mg | ORAL_TABLET | Freq: Every day | ORAL | 0 refills | Status: DC

## 2018-08-25 NOTE — Patient Instructions (Addendum)
Follow up in 6 months to recheck mood and weight loss progress We'll notify you of your lab results and make any changes if needed Continue to work on healthy diet and regular exercise- you can do it! Call with any questions or concerns Happy Holidays!!!!

## 2018-08-25 NOTE — Assessment & Plan Note (Signed)
Pt's PE WNL w/ exception of obesity.  UTD on GYN.  Declines flu.  Check labs.  Anticipatory guidance provided.

## 2018-08-25 NOTE — Progress Notes (Signed)
   Subjective:    Patient ID: Kristi Hill, female    DOB: [birth year], 47 y.o.   MRN: [id number]  HPI CPE- UTD on pap, mammo (Dr Mel Almond).  Declines flu.  Pt is down 7 lbs since last visit.  Has resumed walking.  Pt is concerned about 'early menopause' due to changing, 'horrible' periods.   Review of Systems Patient reports no vision/ hearing changes, adenopathy,fever, weight change,  persistant/recurrent hoarseness , swallowing issues, chest pain, palpitations, edema, persistant/recurrent cough, hemoptysis, dyspnea (rest/exertional/paroxysmal nocturnal), gastrointestinal bleeding (melena, rectal bleeding), abdominal pain, significant heartburn, bowel changes, GU symptoms (dysuria, hematuria, incontinence), Gyn symptoms (abnormal  bleeding, pain),  syncope, focal weakness, memory loss, numbness & tingling, skin/hair/nail changes, abnormal bruising or bleeding, anxiety, or depression.     Objective:   Physical Exam General Appearance:    Alert, cooperative, no distress, appears stated age, obese  Head:    Normocephalic, without obvious abnormality, atraumatic  Eyes:    PERRL, conjunctiva/corneas clear, EOM's intact, fundi    benign, both eyes  Ears:    Normal TM's and external ear canals, both ears  Nose:   Nares normal, septum midline, mucosa normal, no drainage    or sinus tenderness  Throat:   Lips, mucosa, and tongue normal; teeth and gums normal  Neck:   Supple, symmetrical, trachea midline, no adenopathy;    Thyroid: no enlargement/tenderness/nodules  Back:     Symmetric, no curvature, ROM normal, no CVA tenderness  Lungs:     Clear to auscultation bilaterally, respirations unlabored  Chest Wall:    No tenderness or deformity   Heart:    Regular rate and rhythm, S1 and S2 normal, no murmur, rub   or gallop  Breast Exam:    Deferred to GYN  Abdomen:     Soft, non-tender, bowel sounds active all four quadrants,    no masses, no organomegaly  Genitalia:    Deferred to GYN  Rectal:     Extremities:   Extremities normal, atraumatic, no cyanosis or edema  Pulses:   2+ and symmetric all extremities  Skin:   Skin color, texture, turgor normal, no rashes or lesions  Lymph nodes:   Cervical, supraclavicular, and axillary nodes normal  Neurologic:   CNII-XII intact, normal strength, sensation and reflexes    throughout          Assessment & Plan:

## 2018-08-25 NOTE — Assessment & Plan Note (Signed)
Ongoing issue for pt.  Applauded her weight loss efforts- she's down 7 lbs.  Encouraged her to continue healthy diet and regular exercise.  Check labs to risk stratify.  Will follow.

## 2018-08-26 ENCOUNTER — Encounter: Payer: Self-pay | Admitting: General Practice

## 2018-10-01 ENCOUNTER — Other Ambulatory Visit: Payer: Self-pay | Admitting: Family Medicine

## 2018-10-01 MED ORDER — AMPHETAMINE-DEXTROAMPHET ER 25 MG PO CP24: 25.0000 mg | ORAL_CAPSULE | ORAL | 0 refills | Status: DC

## 2018-10-01 NOTE — Telephone Encounter (Signed)
Last OV 08/25/18 adderall last filled 08/25/18 #90 with 0

## 2018-10-01 NOTE — Telephone Encounter (Signed)
Copied from [place] [id number]. Topic: Quick Communication - Rx Refill/Question >> Oct 01, 2018  1:49 PM Blase Mess A wrote: Medication: amphetamine-dextroamphetamine (ADDERALL XR) 25 MG 24 hr capsule [[id number]]   Has the patient contacted their pharmacy? Yes  (Agent: If no, request that the patient contact the pharmacy for the refill.) (Agent: If yes, when and what did the pharmacy advise?)  Preferred Pharmacy (with phone number or street name:[id number] [address], [place] [phone] (Phone) [phone] (Fax)    Agent: Please be advised that RX refills may take up to 3 business days. We ask that you follow-up with your pharmacy.

## 2018-11-03 ENCOUNTER — Telehealth: Payer: Self-pay | Admitting: Family Medicine

## 2018-11-03 MED ORDER — AMPHETAMINE-DEXTROAMPHET ER 25 MG PO CP24: 25.0000 mg | ORAL_CAPSULE | ORAL | 0 refills | Status: DC

## 2018-11-03 NOTE — Telephone Encounter (Signed)
Medication not delegated to NT to refill  

## 2018-11-03 NOTE — Telephone Encounter (Signed)
Last Filled: 10/01/2018   #90, 0 Last OV: 08/25/2018

## 2018-11-03 NOTE — Telephone Encounter (Signed)
Copied from [place] [id number]. Topic: Quick Communication - Rx Refill/Question >> Nov 03, 2018  3:40 PM Windy Kalata wrote: Medication:  amphetamine-dextroamphetamine (ADDERALL XR) 25 MG 24 hr capsule Has the patient contacted their pharmacy? No. (Agent: If no, request that the patient contact the pharmacy for the refill.) (Agent: If yes, when and what did the pharmacy advise?)  Preferred Pharmacy (with phone number or street name): [id number] [place], [place] - [place] [phone] (Phone) [phone] (Fax)    Agent: Please be advised that RX refills may take up to 3 business days. We ask that you follow-up with your pharmacy.

## 2018-11-03 NOTE — Addendum Note (Signed)
Addended by: Midge Minium on: 11/03/2018 03:55 PM   Modules accepted: Orders

## 2018-11-03 NOTE — Telephone Encounter (Signed)
Reviewed PDMP and it indicates pt only got #30 of her Adderall XR 25mg  in January and not the 30 that were prescribed for her.  Will refill again today.

## 2018-11-24 ENCOUNTER — Other Ambulatory Visit: Payer: Self-pay | Admitting: Family Medicine

## 2018-11-24 DIAGNOSIS — F988 Other specified behavioral and emotional disorders with onset usually occurring in childhood and adolescence: Secondary | ICD-10-CM

## 2018-11-24 NOTE — Telephone Encounter (Signed)
Copied from [place] [id number]. Topic: Quick Communication - Rx Refill/Question >> Nov 24, 2018  3:16 PM Ahmed Prima L wrote: Medication: amphetamine-dextroamphetamine (ADDERALL) 10 MG tablet  Has the patient contacted their pharmacy? Yes (Agent: If no, request that the patient contact the pharmacy for the refill.) (Agent: If yes, when and what did the pharmacy advise?)  Preferred Pharmacy (with phone number or street name): [id number] [address], [place] - [place] [address] Phone: [phone] Fax: [phone]    Agent: Please be advised that RX refills may take up to 3 business days. We ask that you follow-up with your pharmacy.

## 2018-11-24 NOTE — Telephone Encounter (Signed)
Requested medication (s) are due for refill today -yes  Requested medication (s) are on the active medication list -yes  Future visit scheduled -no  Last refill: 11/03/18  Notes to clinic: Patient is requesting a non delegated Rx- sent for PCP review  Requested Prescriptions  Pending Prescriptions Disp Refills   amphetamine-dextroamphetamine (ADDERALL) 10 MG tablet 90 tablet 0    Sig: Take 1 tablet (10 mg total) by mouth daily.     Not Delegated - Psychiatry:  Stimulants/ADHD Failed - 11/24/2018  3:28 PM      Failed - This refill cannot be delegated      Failed - Urine Drug Screen completed in last 360 days.      Failed - Valid encounter within last 3 months    Recent Outpatient Visits          3 months ago Physical exam   [place] Midge Minium, MD   6 months ago Attention deficit disorder, unspecified hyperactivity presence   [place] [facility] [place] Midge Minium, MD   1 year ago Depression, unspecified depression type   [place] [facility] [place] Midge Minium, MD   2 years ago Depression, unspecified depression type   [place] [facility] [place] Midge Minium, MD   3 years ago Physical exam   [facility] at [place], MD              Requested Prescriptions  Pending Prescriptions Disp Refills   amphetamine-dextroamphetamine (ADDERALL) 10 MG tablet 90 tablet 0    Sig: Take 1 tablet (10 mg total) by mouth daily.     Not Delegated - Psychiatry:  Stimulants/ADHD Failed - 11/24/2018  3:28 PM      Failed - This refill cannot be delegated      Failed - Urine Drug Screen completed in last 360 days.      Failed - Valid encounter within last 3 months    Recent Outpatient Visits          3 months ago Physical exam   [place] Midge Minium, MD   6 months ago Attention deficit disorder, unspecified hyperactivity presence   [place] [facility] [place] Midge Minium, MD   1 year ago Depression, unspecified depression type   [facility] Midge Minium, MD   2 years ago Depression, unspecified depression type   [place] [facility] [place] Midge Minium, MD   3 years ago Physical exam   [facility] at [facility] Midge Minium, MD

## 2018-11-26 MED ORDER — AMPHETAMINE-DEXTROAMPHETAMINE 10 MG PO TABS: 10.0000 mg | ORAL_TABLET | Freq: Every day | ORAL | 0 refills | Status: DC

## 2018-11-30 ENCOUNTER — Other Ambulatory Visit: Payer: Self-pay | Admitting: Family Medicine

## 2018-12-01 NOTE — Telephone Encounter (Signed)
Last OV 08/25/18 Zolpidem last filled 08/11/18 #15 with 3

## 2018-12-02 ENCOUNTER — Other Ambulatory Visit: Payer: Self-pay | Admitting: Family Medicine

## 2018-12-02 NOTE — Telephone Encounter (Signed)
Copied from [place] [id number]. Topic: Quick Communication - Rx Refill/Question >> Dec 02, 2018  2:49 PM Scherrie Gerlach wrote: Medication: amphetamine-dextroamphetamine (ADDERALL XR) 25 MG 24 hr capsule  [id number] [place] [place] [phone] (Phone) [phone] (Fax)

## 2018-12-02 NOTE — Telephone Encounter (Signed)
Adderall XR 25 mg last rx 11/03/18 #30 Last OV: 08/25/18 CPE CSC: None

## 2018-12-03 MED ORDER — AMPHETAMINE-DEXTROAMPHET ER 25 MG PO CP24: 25.0000 mg | ORAL_CAPSULE | ORAL | 0 refills | Status: DC

## 2019-01-04 ENCOUNTER — Other Ambulatory Visit: Payer: Self-pay | Admitting: Family Medicine

## 2019-01-04 NOTE — Telephone Encounter (Signed)
Adderall last rx 12/03/18 #30 LOV: 08/25/18 ADHD  Please advise

## 2019-01-05 MED ORDER — AMPHETAMINE-DEXTROAMPHET ER 25 MG PO CP24: 25.0000 mg | ORAL_CAPSULE | ORAL | 0 refills | Status: DC

## 2019-02-04 ENCOUNTER — Other Ambulatory Visit: Payer: Self-pay | Admitting: Family Medicine

## 2019-02-04 MED ORDER — AMPHETAMINE-DEXTROAMPHET ER 25 MG PO CP24: 25.0000 mg | ORAL_CAPSULE | ORAL | 0 refills | Status: DC

## 2019-02-04 NOTE — Telephone Encounter (Signed)
Last OV 08/25/2018 adderall last filled 01/05/19 #30 with 0

## 2019-02-04 NOTE — Telephone Encounter (Signed)
Pt called in asking for a refill on the Adderall XR, [facility] [address] [facility].   Pt can be reached at the home #

## 2019-02-08 ENCOUNTER — Other Ambulatory Visit: Payer: Self-pay | Admitting: Family Medicine

## 2019-02-22 ENCOUNTER — Other Ambulatory Visit: Payer: Self-pay | Admitting: Family Medicine

## 2019-02-22 DIAGNOSIS — F988 Other specified behavioral and emotional disorders with onset usually occurring in childhood and adolescence: Secondary | ICD-10-CM

## 2019-02-22 NOTE — Telephone Encounter (Signed)
Last OV 08/25/18 adderall last filled 02/04/19 adderall 10mg  last filled 11/26/18 #90 with 0

## 2019-02-22 NOTE — Telephone Encounter (Signed)
Pt asking for refill on adderall[facility] [address]

## 2019-02-23 MED ORDER — AMPHETAMINE-DEXTROAMPHET ER 25 MG PO CP24: 25.0000 mg | ORAL_CAPSULE | ORAL | 0 refills | Status: DC

## 2019-02-23 MED ORDER — AMPHETAMINE-DEXTROAMPHETAMINE 10 MG PO TABS: 10.0000 mg | ORAL_TABLET | Freq: Every day | ORAL | 0 refills | Status: DC

## 2019-03-05 ENCOUNTER — Other Ambulatory Visit: Payer: Self-pay | Admitting: Family Medicine

## 2019-03-13 ENCOUNTER — Other Ambulatory Visit: Payer: Self-pay | Admitting: Family Medicine

## 2019-03-14 NOTE — Telephone Encounter (Signed)
Last OV 08/25/18 Zolpidem last filled 12/01/18 #15 with 3

## 2019-03-31 ENCOUNTER — Other Ambulatory Visit: Payer: Self-pay | Admitting: Family Medicine

## 2019-03-31 NOTE — Telephone Encounter (Signed)
Pt called in asking for a refill on the Adderall XR, she states it's not due for a refill until next week but she will be leaving to go out of town on Saturday and will be gone a week. She wanted to know if Dr. Birdie Riddle would send in a the refill early. [facility] [address] and can be reach at the home #

## 2019-03-31 NOTE — Telephone Encounter (Signed)
Last OV 08/25/18 adderall last filled 02/23/19 #30 with 0

## 2019-04-01 MED ORDER — AMPHETAMINE-DEXTROAMPHET ER 25 MG PO CP24: 25.0000 mg | ORAL_CAPSULE | ORAL | 0 refills | Status: DC

## 2019-04-01 NOTE — Telephone Encounter (Signed)
Pt called back asking for an update on the medication refill. Please advise

## 2019-04-26 ENCOUNTER — Ambulatory Visit (INDEPENDENT_AMBULATORY_CARE_PROVIDER_SITE_OTHER): Payer: Managed Care, Other (non HMO) | Admitting: Physician Assistant

## 2019-04-26 ENCOUNTER — Encounter: Payer: Self-pay | Admitting: Physician Assistant

## 2019-04-26 ENCOUNTER — Other Ambulatory Visit: Payer: Self-pay

## 2019-04-26 VITALS — Temp 98.5°F | Ht 68.5 in | Wt 224.0 lb

## 2019-04-26 DIAGNOSIS — M5441 Lumbago with sciatica, right side: Secondary | ICD-10-CM | POA: Diagnosis not present

## 2019-04-26 DIAGNOSIS — M5442 Lumbago with sciatica, left side: Secondary | ICD-10-CM

## 2019-04-26 MED ORDER — METHYLPREDNISOLONE 4 MG PO TBPK: ORAL_TABLET | ORAL | 0 refills | Status: DC
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
## 2019-04-26 NOTE — Progress Notes (Signed)
I have discussed the procedure for the virtual visit with the patient who has given consent to proceed with assessment and treatment.   Symeon Puleo L Salote Weidmann, CMA     

## 2019-05-05 ENCOUNTER — Other Ambulatory Visit: Payer: Self-pay | Admitting: Family Medicine

## 2019-05-05 MED ORDER — AMPHETAMINE-DEXTROAMPHET ER 25 MG PO CP24: 25.0000 mg | ORAL_CAPSULE | ORAL | 0 refills | Status: DC

## 2019-05-05 NOTE — Telephone Encounter (Signed)
Please advise on refill.

## 2019-05-05 NOTE — Telephone Encounter (Signed)
Copied from [place] [id number]. Topic: Quick Communication - Rx Refill/Question >> May 05, 2019 12:28 PM Erick Blinks wrote: Medication: amphetamine-dextroamphetamine (ADDERALL XR) 25 MG 24 hr capsule  Has the patient contacted their pharmacy? Yes.   (Agent: If no, request that the patient contact the pharmacy for the refill.) (Agent: If yes, when and what did the pharmacy advise?)  Preferred Pharmacy (with phone number or street name): [id number] [address], [place] - [place] [address] Phone: [phone] Fax: [phone]    Agent: Please be advised that RX refills may take up to 3 business days. We ask that you follow-up with your pharmacy.

## 2019-05-05 NOTE — Telephone Encounter (Signed)
Last OV 04/26/19 (CODY- low back pain) adderall last filled 04/01/19 #30 with 0

## 2019-05-09 ENCOUNTER — Telehealth: Payer: Self-pay | Admitting: *Deleted

## 2019-05-09 DIAGNOSIS — M5441 Lumbago with sciatica, right side: Secondary | ICD-10-CM

## 2019-05-09 DIAGNOSIS — R202 Paresthesia of skin: Secondary | ICD-10-CM

## 2019-05-09 DIAGNOSIS — M5442 Lumbago with sciatica, left side: Secondary | ICD-10-CM

## 2019-05-09 NOTE — Telephone Encounter (Signed)
Would have her still limit any heavy lifting or overexertion. As long as there is no severe pain currently or alarm symptoms (numbness in groin), weakness of legs or change in bowel/bladder habits we will first proceed with x-ray lumbar spine. Ok to place order.

## 2019-05-09 NOTE — Telephone Encounter (Signed)
Patient called to state that she has finished her prednisone pack. She does note that the steroid did help while she was on it, but after a few day of finishing the tingling/vibrating feeling is back, and she is also now noting some occasional numbness pinky toe on right foot. She said that during the visit it was mentioned that imaging might be needed.  She is aware that I am routing message to [place] and will let her know what he says.

## 2019-05-10 NOTE — Telephone Encounter (Signed)
spoke with pt and advised. [facility].

## 2019-05-10 NOTE — Telephone Encounter (Signed)
Called pt and LMOVM to return call. Orders were placed.

## 2019-05-10 NOTE — Addendum Note (Signed)
Addended by: Davis Gourd on: 05/10/2019 02:01 PM   Modules accepted: Orders

## 2019-05-11 ENCOUNTER — Ambulatory Visit (HOSPITAL_BASED_OUTPATIENT_CLINIC_OR_DEPARTMENT_OTHER)
Admission: RE | Admit: 2019-05-11 | Discharge: 2019-05-11 | Disposition: A | Discharge status: Home / Self Care | Payer: 59 | Source: Ambulatory Visit | Attending: Physician Assistant | Admitting: Physician Assistant

## 2019-05-11 ENCOUNTER — Other Ambulatory Visit: Payer: Self-pay

## 2019-05-11 DIAGNOSIS — M5441 Lumbago with sciatica, right side: Secondary | ICD-10-CM | POA: Diagnosis present

## 2019-05-11 DIAGNOSIS — R202 Paresthesia of skin: Secondary | ICD-10-CM

## 2019-05-11 DIAGNOSIS — M5442 Lumbago with sciatica, left side: Secondary | ICD-10-CM | POA: Insufficient documentation

## 2019-06-03 ENCOUNTER — Other Ambulatory Visit: Payer: Self-pay | Admitting: *Deleted

## 2019-06-03 DIAGNOSIS — F988 Other specified behavioral and emotional disorders with onset usually occurring in childhood and adolescence: Secondary | ICD-10-CM

## 2019-06-03 NOTE — Telephone Encounter (Signed)
Last OV 08/25/18 Last Filled:  25mg  05/05/2019 #30, 0      10mg  02/23/2019 #90, 0

## 2019-06-06 MED ORDER — AMPHETAMINE-DEXTROAMPHET ER 25 MG PO CP24: 25.0000 mg | ORAL_CAPSULE | ORAL | 0 refills | Status: DC

## 2019-06-06 MED ORDER — AMPHETAMINE-DEXTROAMPHETAMINE 10 MG PO TABS: 10.0000 mg | ORAL_TABLET | Freq: Every day | ORAL | 0 refills | Status: DC

## 2019-06-15 ENCOUNTER — Other Ambulatory Visit: Payer: Self-pay | Admitting: Family Medicine

## 2019-06-16 NOTE — Telephone Encounter (Signed)
Last OV 04/26/19 ambien last filled 03/14/19 #15 with 3

## 2019-07-06 ENCOUNTER — Other Ambulatory Visit: Payer: Self-pay | Admitting: *Deleted

## 2019-07-06 DIAGNOSIS — F988 Other specified behavioral and emotional disorders with onset usually occurring in childhood and adolescence: Secondary | ICD-10-CM

## 2019-07-06 MED ORDER — AMPHETAMINE-DEXTROAMPHET ER 25 MG PO CP24: 25.0000 mg | ORAL_CAPSULE | ORAL | 0 refills | Status: DC

## 2019-07-06 NOTE — Telephone Encounter (Signed)
Last Filled: 06/06/2019 #30, 0 Last OV: 08/25/2018 (CPE) - Next sch for 09/01/2019

## 2019-08-08 ENCOUNTER — Other Ambulatory Visit: Payer: Self-pay | Admitting: Family Medicine

## 2019-08-08 DIAGNOSIS — F988 Other specified behavioral and emotional disorders with onset usually occurring in childhood and adolescence: Secondary | ICD-10-CM

## 2019-08-08 NOTE — Telephone Encounter (Signed)
°  LAST APPOINTMENT DATE: @@LASTENCT @  NEXT APPOINTMENT DATE:@12 /06/2019  MEDICATION: Adderall  [facility] [address]  **Let patient know to contact pharmacy at the end of the day to make sure medication is ready. **  ** Please notify patient to allow 48-72 hours to process**  **Encourage patient to contact the pharmacy for refills or they can request refills through [facility]**  CLINICAL FILLS OUT ALL BELOW:   LAST REFILL: 07/06/19  QTY:30  REFILL DATE:    OTHER COMMENTS:    Okay for refill?  Please advise

## 2019-08-08 NOTE — Telephone Encounter (Signed)
Last OV 04/26/19 adderall last filled 07/06/19 #30 with 0

## 2019-08-10 MED ORDER — AMPHETAMINE-DEXTROAMPHET ER 25 MG PO CP24: 25.0000 mg | ORAL_CAPSULE | ORAL | 0 refills | Status: DC

## 2019-08-26 ENCOUNTER — Other Ambulatory Visit: Payer: Self-pay | Admitting: *Deleted

## 2019-08-26 DIAGNOSIS — F988 Other specified behavioral and emotional disorders with onset usually occurring in childhood and adolescence: Secondary | ICD-10-CM

## 2019-08-26 MED ORDER — AMPHETAMINE-DEXTROAMPHETAMINE 10 MG PO TABS: 10.0000 mg | ORAL_TABLET | Freq: Every day | ORAL | 0 refills | Status: DC

## 2019-08-26 NOTE — Telephone Encounter (Signed)
Last Filled: 06/06/2019 #90, 0 Last OV: (acute) 04/26/2019, (CPE) 08/26/2019 Next OV: 09/01/2019

## 2019-09-01 ENCOUNTER — Encounter: Payer: 59 | Admitting: Family Medicine

## 2019-09-12 ENCOUNTER — Telehealth: Payer: Self-pay | Admitting: Family Medicine

## 2019-09-12 NOTE — Telephone Encounter (Signed)
Pt requesting Rx Adderrall 25mg  Pt pharmacy [facility]

## 2019-09-12 NOTE — Telephone Encounter (Signed)
Last OV 04/26/19 adderall last filled 08/10/19 #30 with 0

## 2019-09-13 MED ORDER — AMPHETAMINE-DEXTROAMPHET ER 25 MG PO CP24: 25.0000 mg | ORAL_CAPSULE | ORAL | 0 refills | Status: DC

## 2019-09-13 NOTE — Telephone Encounter (Signed)
Prescription filled at pt request °

## 2019-10-11 ENCOUNTER — Other Ambulatory Visit: Payer: Self-pay

## 2019-10-11 NOTE — Telephone Encounter (Signed)
Last refill: 12.22.20 #30, 0 Last OV: 8.4.20 dx. Back pain

## 2019-10-12 MED ORDER — AMPHETAMINE-DEXTROAMPHET ER 25 MG PO CP24: 25.0000 mg | ORAL_CAPSULE | ORAL | 0 refills | Status: DC

## 2019-10-16 ENCOUNTER — Other Ambulatory Visit: Payer: Self-pay | Admitting: Family Medicine

## 2019-10-17 ENCOUNTER — Encounter: Payer: No Typology Code available for payment source | Admitting: Family Medicine

## 2019-10-21 ENCOUNTER — Other Ambulatory Visit: Payer: Self-pay | Admitting: Family Medicine

## 2019-10-21 NOTE — Telephone Encounter (Signed)
She has an appt next month so ok to fill

## 2019-10-21 NOTE — Telephone Encounter (Signed)
Pt last CPE was 2019. She was seen last year for low back pain. Ok to refill?

## 2019-11-11 ENCOUNTER — Other Ambulatory Visit: Payer: Self-pay

## 2019-11-11 MED ORDER — AMPHETAMINE-DEXTROAMPHET ER 25 MG PO CP24: 25.0000 mg | ORAL_CAPSULE | ORAL | 0 refills | Status: DC

## 2019-11-11 NOTE — Telephone Encounter (Signed)
Patient called in to get a refill on her Aderall  25mg  cap.  Last office visit 04/26/19 Last filled 10/12/19 #30, no refills Next appt 12/14/19 for CPE.

## 2019-11-16 ENCOUNTER — Encounter: Payer: No Typology Code available for payment source | Admitting: Family Medicine

## 2019-12-09 ENCOUNTER — Other Ambulatory Visit: Payer: Self-pay

## 2019-12-09 DIAGNOSIS — F988 Other specified behavioral and emotional disorders with onset usually occurring in childhood and adolescence: Secondary | ICD-10-CM

## 2019-12-09 MED ORDER — AMPHETAMINE-DEXTROAMPHETAMINE 10 MG PO TABS: 10.0000 mg | ORAL_TABLET | Freq: Every day | ORAL | 0 refills | Status: DC

## 2019-12-09 NOTE — Telephone Encounter (Signed)
[place] Database Verified LR: 09-12-2019 Qty: 49 Last office visit: 04-26-2019 Upcoming appointment: 12-14-2019

## 2019-12-09 NOTE — Telephone Encounter (Signed)
Patient request amphetamine-dextroamphetamine (ADDERALL) 10 MG tablet [facility]

## 2019-12-14 ENCOUNTER — Other Ambulatory Visit: Payer: Self-pay

## 2019-12-14 ENCOUNTER — Encounter: Payer: Self-pay | Admitting: Family Medicine

## 2019-12-14 ENCOUNTER — Ambulatory Visit (INDEPENDENT_AMBULATORY_CARE_PROVIDER_SITE_OTHER): Payer: 59 | Admitting: Family Medicine

## 2019-12-14 VITALS — BP 122/83 | HR 107 | Temp 97.8°F | Resp 16 | Ht 69.0 in | Wt 234.4 lb

## 2019-12-14 DIAGNOSIS — Z23 Encounter for immunization: Secondary | ICD-10-CM | POA: Diagnosis not present

## 2019-12-14 DIAGNOSIS — N951 Menopausal and female climacteric states: Secondary | ICD-10-CM | POA: Diagnosis not present

## 2019-12-14 DIAGNOSIS — Z0000 Encounter for general adult medical examination without abnormal findings: Secondary | ICD-10-CM | POA: Diagnosis not present

## 2019-12-14 DIAGNOSIS — F32A Depression, unspecified: Secondary | ICD-10-CM

## 2019-12-14 DIAGNOSIS — E669 Obesity, unspecified: Secondary | ICD-10-CM | POA: Diagnosis not present

## 2019-12-14 DIAGNOSIS — F329 Major depressive disorder, single episode, unspecified: Secondary | ICD-10-CM

## 2019-12-14 MED ORDER — DESVENLAFAXINE SUCCINATE ER 100 MG PO TB24: 100.0000 mg | ORAL_TABLET | Freq: Every day | ORAL | 3 refills | Status: DC

## 2019-12-14 MED ORDER — AMPHETAMINE-DEXTROAMPHET ER 25 MG PO CP24: 25.0000 mg | ORAL_CAPSULE | ORAL | 0 refills | Status: DC

## 2019-12-14 NOTE — Assessment & Plan Note (Signed)
New.  Pt is having mood swings, headaches, abd cramping, and heavy periods.  She is interested in [facility] and [place] levels that she read about online.  Check labs and encouraged her to discuss her menstrual cycles w/ GYN.  Pt expressed understanding and is in agreement w/ plan.

## 2019-12-14 NOTE — Patient Instructions (Addendum)
Follow up in 4-6 weeks to recheck mood We'll notify you of your lab results and make any changes if needed Continue to work on healthy diet and regular exercise- you can do it! Call and schedule your mammogram w/ GYN INCREASE the Pristiq to 100mg  daily Call with any questions or concerns Stay Safe!  Stay Healthy!

## 2019-12-14 NOTE — Progress Notes (Signed)
   Subjective:    Patient ID: Kristi Hill, female    DOB: 08-Feb-1971, 49 y.o.   MRN: [id number]  HPI CPE- UTD on pap, due for mammo (plans to schedule).  Due for Tdap this year.  Pt not doing COVID vaccines.  Has gained 10 lbs since last visit.  Peri-menopause- pt reports 'raging hormones'.  Very tearful.  + lack of energy.  Increased pain w/ periods.   Review of Systems Patient reports no vision/ hearing changes, adenopathy,fever, persistant/recurrent hoarseness , swallowing issues, chest pain, palpitations, edema, persistant/recurrent cough, hemoptysis, dyspnea (rest/exertional/paroxysmal nocturnal), gastrointestinal bleeding (melena, rectal bleeding), abdominal pain, significant heartburn, bowel changes, GU symptoms (dysuria, hematuria, incontinence), Gyn symptoms (abnormal  bleeding, pain),  syncope, focal weakness, memory loss, numbness & tingling, skin/hair/nail changes, abnormal bruising or bleeding.   + depression- deteriorated.  pt is interested in increasing depression meds.  Has been on 100mg  previously.  This visit occurred during the SARS-CoV-2 public health emergency.  Safety protocols were in place, including screening questions prior to the visit, additional usage of staff PPE, and extensive cleaning of exam room while observing appropriate contact time as indicated for disinfecting solutions.       Objective:   Physical Exam General Appearance:    Alert, cooperative, no distress, appears stated age, obese  Head:    Normocephalic, without obvious abnormality, atraumatic  Eyes:    PERRL, conjunctiva/corneas clear, EOM's intact, fundi    benign, both eyes  Ears:    Normal TM's and external ear canals, both ears  Nose:   Deferred due to COVID  Throat:   Neck:   Supple, symmetrical, trachea midline, no adenopathy;    Thyroid: no enlargement/tenderness/nodules  Back:     Symmetric, no curvature, ROM normal, no CVA tenderness  Lungs:     Clear to auscultation bilaterally,  respirations unlabored  Chest Wall:    No tenderness or deformity   Heart:    Regular rate and rhythm, S1 and S2 normal, no murmur, rub   or gallop  Breast Exam:    Deferred to GYN  Abdomen:     Soft, non-tender, bowel sounds active all four quadrants,    no masses, no organomegaly  Genitalia:    Deferred to GYN  Rectal:    Extremities:   Extremities normal, atraumatic, no cyanosis or edema  Pulses:   2+ and symmetric all extremities  Skin:   Skin color, texture, turgor normal, no rashes or lesions  Lymph nodes:   Cervical, supraclavicular, and axillary nodes normal  Neurologic:   CNII-XII intact, normal strength, sensation and reflexes    throughout          Assessment & Plan:

## 2019-12-14 NOTE — Addendum Note (Signed)
Addended by: Davis Gourd on: 12/14/2019 03:20 PM   Modules accepted: Orders

## 2019-12-14 NOTE — Assessment & Plan Note (Signed)
Pt's PE WNL w/ exception of obesity.  Due for mammo- pt to schedule w/ GYN.  UTD on pap.  Tdap given.  Check labs.  Anticipatory guidance provided.

## 2019-12-14 NOTE — Assessment & Plan Note (Signed)
Deteriorated.  Pt has gained 10 lbs since last visit.  Stressed need for healthy diet and regular exercise.  Check labs to risk stratify.  Will follow. 

## 2019-12-14 NOTE — Assessment & Plan Note (Signed)
Deteriorated.  Pt is interested in increasing her Pristiq back to 100mg  daily.  Medication increased and will follow closely.

## 2019-12-15 ENCOUNTER — Encounter: Payer: Self-pay | Admitting: General Practice

## 2019-12-15 LAB — CBC WITH DIFFERENTIAL/PLATELET
Basophils Absolute: 0.1 10*3/uL (ref 0.0–0.1)
Basophils Relative: 1 % (ref 0.0–3.0)
Eosinophils Absolute: 0.1 10*3/uL (ref 0.0–0.7)
Eosinophils Relative: 1.2 % (ref 0.0–5.0)
HCT: 41.7 % (ref 36.0–46.0)
Hemoglobin: 14.2 g/dL (ref 12.0–15.0)
Lymphocytes Relative: 34.9 % (ref 12.0–46.0)
Lymphs Abs: 2.1 10*3/uL (ref 0.7–4.0)
MCHC: 34 g/dL (ref 30.0–36.0)
MCV: 95.7 fl (ref 78.0–100.0)
Monocytes Absolute: 0.7 10*3/uL (ref 0.1–1.0)
Monocytes Relative: 10.6 % (ref 3.0–12.0)
Neutro Abs: 3.2 10*3/uL (ref 1.4–7.7)
Neutrophils Relative %: 52.3 % (ref 43.0–77.0)
Platelets: 282 10*3/uL (ref 150.0–400.0)
RBC: 4.36 Mil/uL (ref 3.87–5.11)
RDW: 13 % (ref 11.5–15.5)
WBC: 6.2 10*3/uL (ref 4.0–10.5)

## 2019-12-15 LAB — BASIC METABOLIC PANEL
BUN: 18 mg/dL (ref 6–23)
CO2: 26 mEq/L (ref 19–32)
Calcium: 9.1 mg/dL (ref 8.4–10.5)
Chloride: 104 mEq/L (ref 96–112)
Creatinine, Ser: 0.81 mg/dL (ref 0.40–1.20)
GFR: 75.83 mL/min (ref >60.00)
Glucose, Bld: 86 mg/dL (ref 70–99)
Potassium: 4 mEq/L (ref 3.5–5.1)
Sodium: 136 mEq/L (ref 135–145)

## 2019-12-15 LAB — LIPID PANEL
Cholesterol: 145 mg/dL (ref 0–200)
HDL: 58.2 mg/dL (ref >39.00)
LDL Cholesterol: 65 mg/dL (ref 0–99)
NonHDL: 86.93
Total CHOL/HDL Ratio: 2
Triglycerides: 112 mg/dL (ref 0.0–149.0)
VLDL: 22.4 mg/dL (ref 0.0–40.0)

## 2019-12-15 LAB — LUTEINIZING HORMONE: LH: 5.15 m[IU]/mL

## 2019-12-15 LAB — TSH: TSH: 0.66 u[IU]/mL (ref 0.35–4.50)

## 2019-12-15 LAB — HEPATIC FUNCTION PANEL
ALT: 8 U/L (ref 0–35)
AST: 18 U/L (ref 0–37)
Albumin: 4.4 g/dL (ref 3.5–5.2)
Alkaline Phosphatase: 48 U/L (ref 39–117)
Bilirubin, Direct: 0.1 mg/dL (ref 0.0–0.3)
Total Bilirubin: 0.5 mg/dL (ref 0.2–1.2)
Total Protein: 7.2 g/dL (ref 6.0–8.3)

## 2019-12-15 LAB — FOLLICLE STIMULATING HORMONE: FSH: 6.4 m[IU]/mL

## 2020-01-10 ENCOUNTER — Other Ambulatory Visit: Payer: Self-pay | Admitting: Family Medicine

## 2020-01-16 ENCOUNTER — Telehealth: Payer: Self-pay

## 2020-01-16 ENCOUNTER — Other Ambulatory Visit: Payer: Self-pay

## 2020-01-16 MED ORDER — AMPHETAMINE-DEXTROAMPHET ER 25 MG PO CP24: 25.0000 mg | ORAL_CAPSULE | ORAL | 0 refills | Status: DC

## 2020-01-16 NOTE — Telephone Encounter (Signed)
  LAST APPOINTMENT DATE: 01/10/2020   NEXT APPOINTMENT DATE:@Visit  date not found  MEDICATION: amphetamine-dextroamphetamine (ADDERALL XR) 25 MG 24 hr capsule  [id number] [address], [place] Phone:  [phone]  Fax:  [phone]       **Let patient know to contact pharmacy at the end of the day to make sure medication is ready. **  ** Please notify patient to allow 48-72 hours to process**  **Encourage patient to contact the pharmacy for refills or they can request refills through [facility]**  CLINICAL FILLS OUT ALL BELOW:   LAST REFILL:  QTY:  REFILL DATE:    OTHER COMMENTS:    Okay for refill?  Please advise

## 2020-01-16 NOTE — Telephone Encounter (Signed)
Last refill: 12/14/19 #30, 0 Last OV: 12/14/19 dx. CPE

## 2020-02-15 ENCOUNTER — Other Ambulatory Visit: Payer: Self-pay | Admitting: Family Medicine

## 2020-02-15 MED ORDER — AMPHETAMINE-DEXTROAMPHET ER 25 MG PO CP24: 25.0000 mg | ORAL_CAPSULE | ORAL | 0 refills | Status: DC

## 2020-02-15 NOTE — Telephone Encounter (Signed)
Pt has called in asking for a refill on the Adderall, [facility]. Please advise

## 2020-02-15 NOTE — Telephone Encounter (Signed)
Called pt to verify which adderall is needed.   Last OV 12/14/19 Adderall last filled 01/16/20 #30 with 0

## 2020-03-08 ENCOUNTER — Other Ambulatory Visit: Payer: Self-pay | Admitting: Family Medicine

## 2020-03-08 DIAGNOSIS — F988 Other specified behavioral and emotional disorders with onset usually occurring in childhood and adolescence: Secondary | ICD-10-CM

## 2020-03-08 NOTE — Telephone Encounter (Deleted)
Last OV 12/14/19 adderall last filled 02/15/20 #90 with 0  Pt would like #90.

## 2020-03-08 NOTE — Telephone Encounter (Signed)
Last OV adderall 10mg  last filled 12/09/19 #90 with 0

## 2020-03-08 NOTE — Telephone Encounter (Signed)
Patient needs Adderall 10 mg. [facility] [place], [place]  - She said that she usually gets a 90 day supply.  Last visit:  12/14/2019 Next visit:  Not scheduled

## 2020-03-09 MED ORDER — AMPHETAMINE-DEXTROAMPHETAMINE 10 MG PO TABS: 10.0000 mg | ORAL_TABLET | Freq: Every day | ORAL | 0 refills | Status: DC

## 2020-03-19 ENCOUNTER — Other Ambulatory Visit: Payer: Self-pay | Admitting: Family Medicine

## 2020-03-19 MED ORDER — AMPHETAMINE-DEXTROAMPHET ER 25 MG PO CP24: 25.0000 mg | ORAL_CAPSULE | ORAL | 0 refills | Status: DC

## 2020-03-19 NOTE — Telephone Encounter (Signed)
Last OV 12/14/19 Adderall 25mg  last filled 02/15/20 #30 with 0

## 2020-03-19 NOTE — Telephone Encounter (Signed)
Pt called in asking for a new script of the Adderall 25mg  XR to be [facility] to the [facility]

## 2020-03-30 IMAGING — DX LUMBAR SPINE - COMPLETE 4+ VIEW
5 series · 5 of 5 positions shown · non-contrast
Comparison: None.

CLINICAL DATA: Low back pain

EXAM:
LUMBAR SPINE - COMPLETE 4+ VIEW

[l-spine ap]
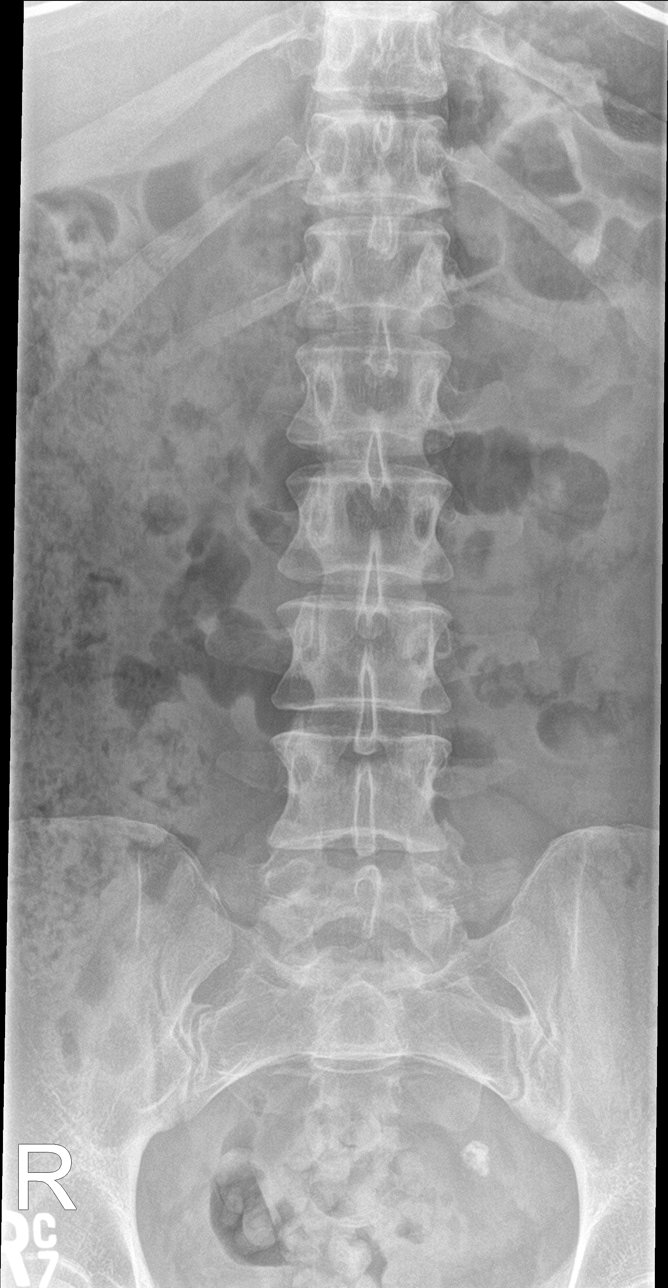

[l-spine obl (1 of 2)]
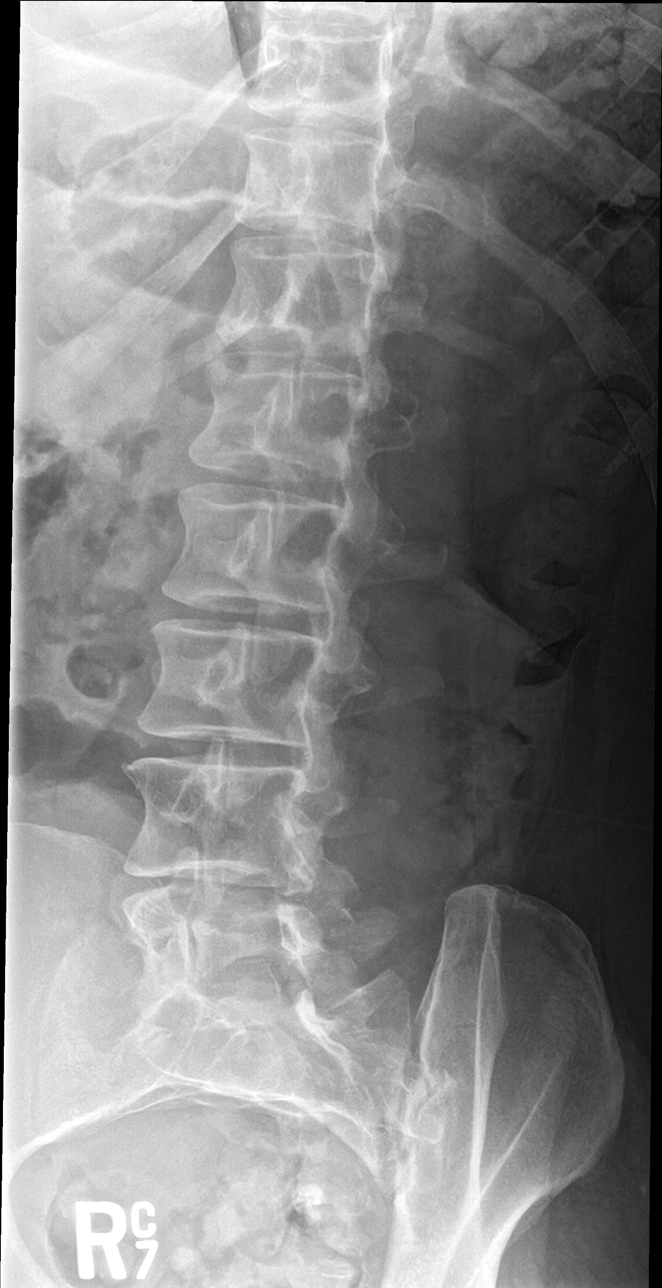

[l-spine obl (2 of 2)]
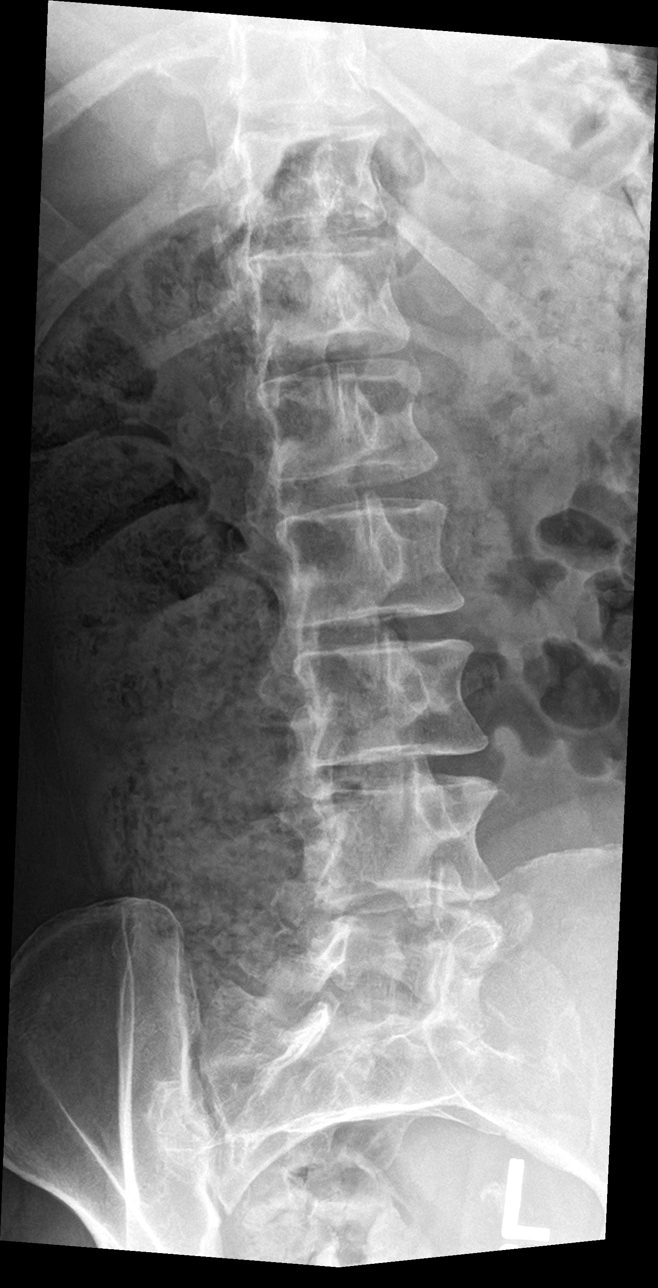

[l-spine lat]
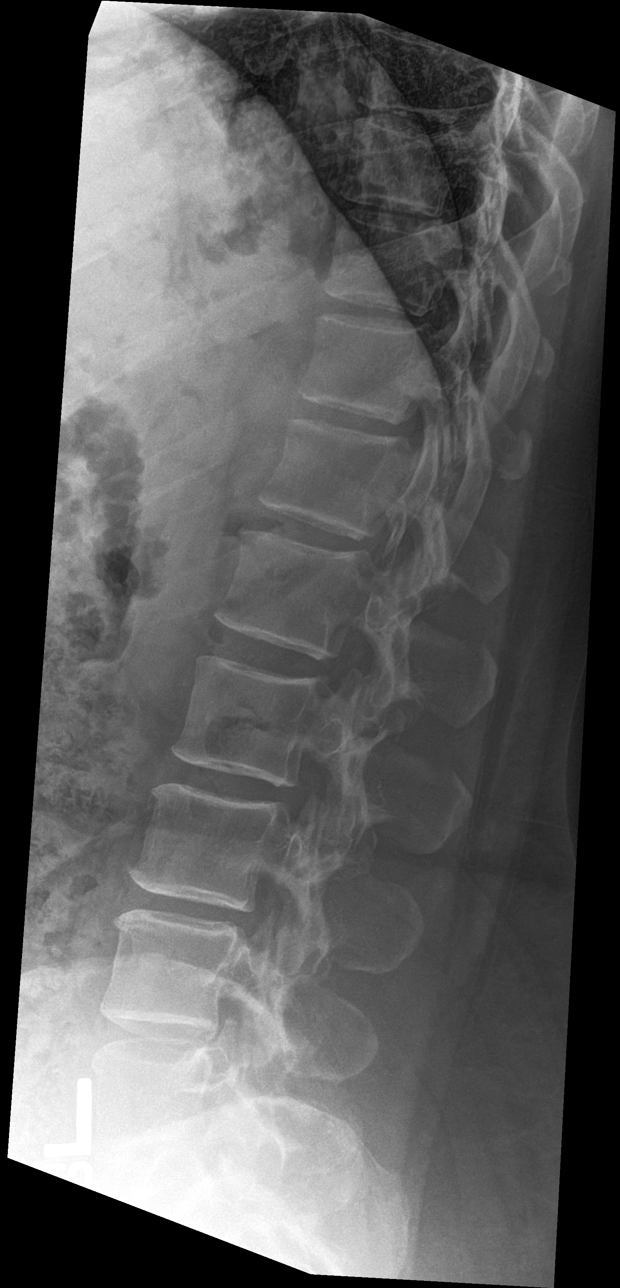

[l-spine spot]
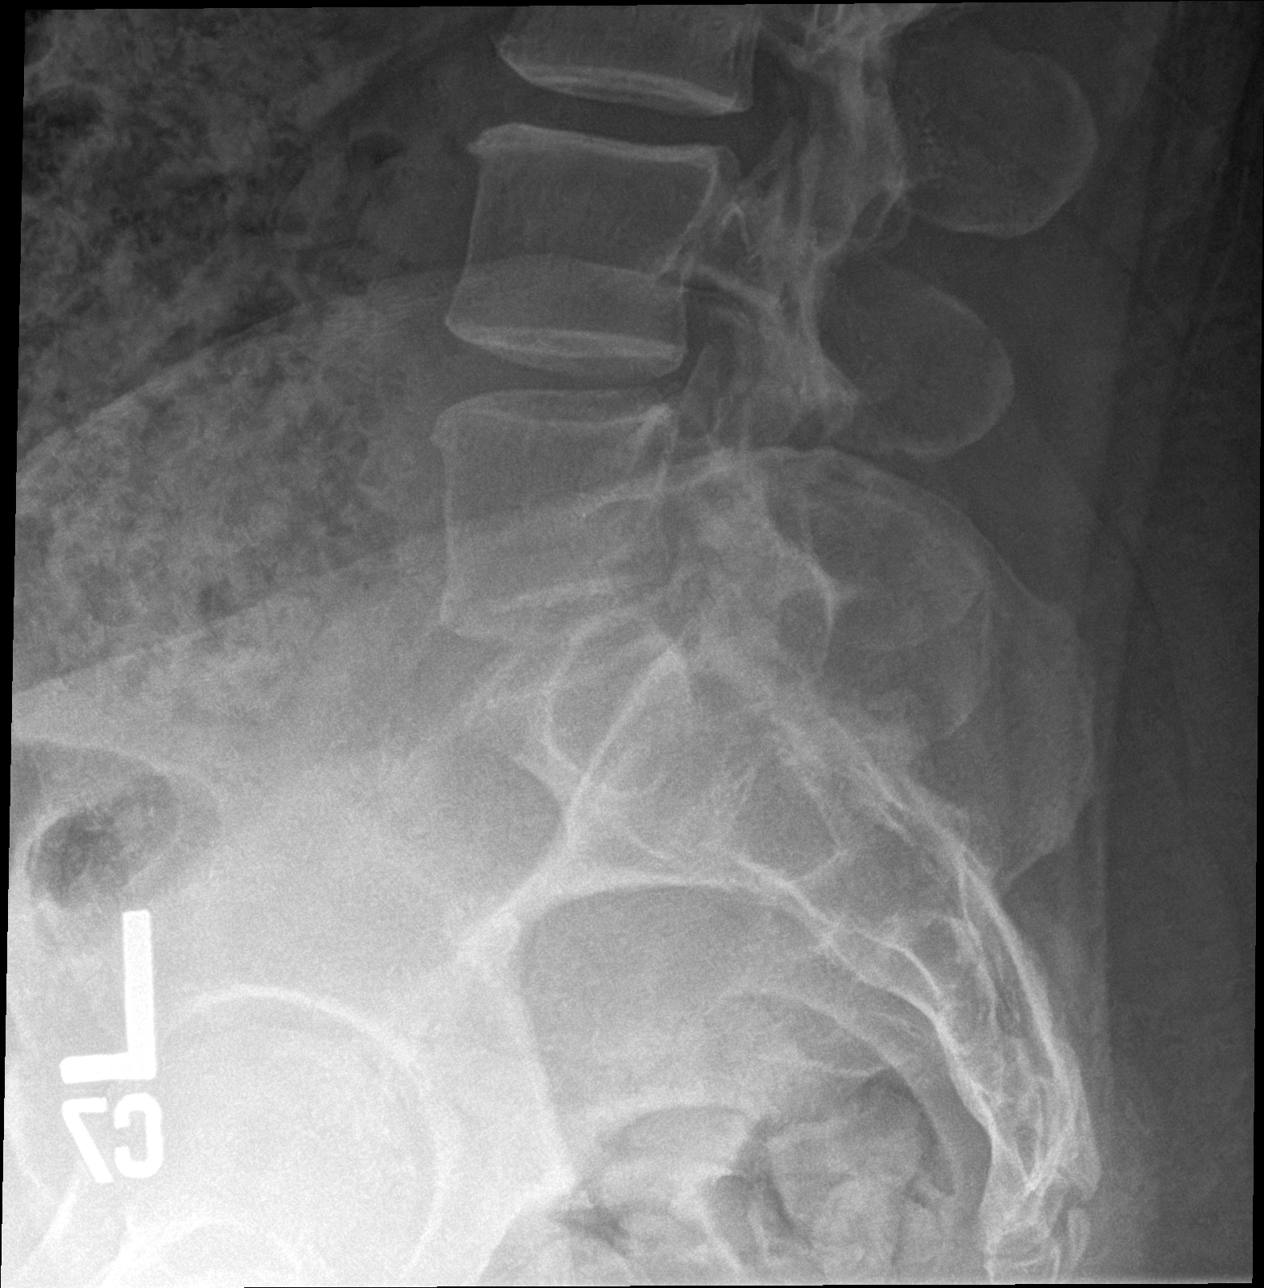

[5 of 5 positions shown; findings below may reference images not displayed]

FINDINGS: There is no acute displaced fracture or dislocation. There are mild
multilevel degenerative changes throughout the lumbar spine. There
is a calcification projecting over the left hemipelvis which may
represent a phlebolith. There is a moderate amount of stool
throughout the visualized colon.
IMPRESSION: No acute osseous abnormality.

## 2020-04-11 ENCOUNTER — Telehealth: Payer: Self-pay | Admitting: Family Medicine

## 2020-04-11 NOTE — Telephone Encounter (Signed)
Paperwork given to PCP for completion and signature

## 2020-04-11 NOTE — Telephone Encounter (Signed)
Paperwork faxed °

## 2020-04-11 NOTE — Telephone Encounter (Signed)
Patient has called in stating that PA for Pristiq has ran out.  States she can not take the generic form.   Patient originally called requesting a letter to be sent to her insurance company.    I asked patient to reach out to her pharmacy and insurance company.  Patient reached out to [facility].  Holland Falling has provided her with a PA phone number.  Patient is requesting the office to call [phone] to start the PA.   Patient states she is currently out of medication.   States the insurance company advised the office to start the PA rather than requesting the PA to be started at the Pharmacy level for faster processing.   Patient would like a call once Holland Falling has been contacted.

## 2020-04-11 NOTE — Telephone Encounter (Signed)
error 

## 2020-04-11 NOTE — Telephone Encounter (Signed)
Form completed and placed in basket  

## 2020-04-12 NOTE — Telephone Encounter (Signed)
Medication Approved per Fax. Called pt and LMOVM to inform.

## 2020-04-12 NOTE — Telephone Encounter (Signed)
Patient called back and states that Dr.Tabori will need to call the [employer] - she said that if you faxed it yesterday that it might be 2 weeks and she can not go that long without her medication.

## 2020-04-19 ENCOUNTER — Other Ambulatory Visit: Payer: Self-pay | Admitting: Family Medicine

## 2020-04-19 DIAGNOSIS — F988 Other specified behavioral and emotional disorders with onset usually occurring in childhood and adolescence: Secondary | ICD-10-CM

## 2020-04-19 NOTE — Telephone Encounter (Signed)
Last OV 12/14/19 adderall last filled 03/19/20 #30 with 0

## 2020-04-19 NOTE — Telephone Encounter (Signed)
Pt called in asking for a new script of the adderall XR 25MG  [facility] the [facility] [address]

## 2020-04-20 MED ORDER — AMPHETAMINE-DEXTROAMPHET ER 25 MG PO CP24: 25.0000 mg | ORAL_CAPSULE | ORAL | 0 refills | Status: DC

## 2020-05-22 ENCOUNTER — Other Ambulatory Visit: Payer: Self-pay | Admitting: Family Medicine

## 2020-05-22 NOTE — Telephone Encounter (Signed)
Last OV 12/14/19 adderall last filled 04/20/20 #30 with 0

## 2020-05-22 NOTE — Telephone Encounter (Signed)
Pt would like refill on medication listed below:  MEDICATION: Adderall 25 MG  [facility] [place]  Comments:   **Let patient know to contact pharmacy at the end of the day to make sure medication is ready. **  ** Please notify patient to allow 48-72 hours to process**  **Encourage patient to contact the pharmacy for refills or they can request refills through [facility]**

## 2020-05-23 MED ORDER — AMPHETAMINE-DEXTROAMPHET ER 25 MG PO CP24: 25.0000 mg | ORAL_CAPSULE | ORAL | 0 refills | Status: DC

## 2020-06-08 ENCOUNTER — Other Ambulatory Visit: Payer: Self-pay | Admitting: Family Medicine

## 2020-06-08 DIAGNOSIS — F988 Other specified behavioral and emotional disorders with onset usually occurring in childhood and adolescence: Secondary | ICD-10-CM

## 2020-06-08 NOTE — Telephone Encounter (Signed)
Last OV 12/14/19 Adderall last filled 03/09/20 #90 with 0

## 2020-06-08 NOTE — Telephone Encounter (Signed)
Pt called after hours asking for a refill on Adderall 10mg s 90 day supply. Pt can be reached at the home #

## 2020-06-11 MED ORDER — AMPHETAMINE-DEXTROAMPHETAMINE 10 MG PO TABS: 10.0000 mg | ORAL_TABLET | Freq: Every day | ORAL | 0 refills | Status: DC

## 2020-06-22 ENCOUNTER — Telehealth: Payer: Self-pay | Admitting: Family Medicine

## 2020-06-22 MED ORDER — AMPHETAMINE-DEXTROAMPHET ER 25 MG PO CP24: 25.0000 mg | ORAL_CAPSULE | ORAL | 0 refills | Status: DC

## 2020-06-22 NOTE — Telephone Encounter (Signed)
Medication filled at pt's request °

## 2020-06-22 NOTE — Telephone Encounter (Signed)
Pt sent a message via team health that she needs a refill on the adderall 25mg s she [facility] [address].

## 2020-06-22 NOTE — Telephone Encounter (Signed)
Last OV 12/14/19 Adderall last filled 05/23/20 #30w with 0

## 2020-07-24 ENCOUNTER — Other Ambulatory Visit: Payer: Self-pay | Admitting: Family Medicine

## 2020-07-24 NOTE — Telephone Encounter (Signed)
..  Medication Refills  Medication:  Adderall 25 mg XR  [facility] [place] [place]  ** Let patient know to contact pharmacy at the end of the day to make sure medication is ready.**  ** Please notify patient to allow 48-72 hours to process.**  ** Encourage patient to contact the pharmacy for refills or they can request refills through [facility]**  Clinical Fills out below:   Last refill:  QTY:  Refill Date:    Other Comments:   Okay for refill?  Please advise.

## 2020-07-25 MED ORDER — AMPHETAMINE-DEXTROAMPHET ER 25 MG PO CP24: 25.0000 mg | ORAL_CAPSULE | ORAL | 0 refills | Status: DC

## 2020-07-25 NOTE — Telephone Encounter (Signed)
Last OV 12/14/19 Adderall last filled 06/22/20 #30 with 0

## 2020-08-24 ENCOUNTER — Telehealth: Payer: Self-pay | Admitting: Family Medicine

## 2020-08-24 DIAGNOSIS — F988 Other specified behavioral and emotional disorders with onset usually occurring in childhood and adolescence: Secondary | ICD-10-CM

## 2020-08-24 NOTE — Telephone Encounter (Signed)
..  Medication Refills  Medication: Adderall  [facility] [place]  ** Let patient know to contact pharmacy at the end of the day to make sure medication is ready.**  ** Please notify patient to allow 48-72 hours to process.**  ** Encourage patient to contact the pharmacy for refills or they can request refills through [facility]**  Clinical Fills out below:   Last refill:  QTY:  Refill Date:    Other Comments:   Okay for refill?  Please advise.

## 2020-08-27 ENCOUNTER — Telehealth: Payer: Self-pay | Admitting: Family Medicine

## 2020-08-27 MED ORDER — AMPHETAMINE-DEXTROAMPHETAMINE 10 MG PO TABS: 10.0000 mg | ORAL_TABLET | Freq: Every day | ORAL | 0 refills | Status: DC

## 2020-08-27 MED ORDER — AMPHETAMINE-DEXTROAMPHET ER 25 MG PO CP24: 25.0000 mg | ORAL_CAPSULE | ORAL | 0 refills | Status: DC

## 2020-08-27 NOTE — Telephone Encounter (Signed)
Refill sent at pt request

## 2020-08-27 NOTE — Telephone Encounter (Signed)
Extended release 25mg  sent to pharmacy

## 2020-08-27 NOTE — Telephone Encounter (Signed)
Encounter made in error - Added information to the previous encounter

## 2020-08-27 NOTE — Addendum Note (Signed)
Addended by: Midge Minium on: 08/27/2020 03:13 PM   Modules accepted: Orders

## 2020-08-27 NOTE — Telephone Encounter (Signed)
Patient called back and said that the 10mg  was called in, instead of the 25 mg. That she had requested be sent.  Please advise

## 2020-09-12 ENCOUNTER — Other Ambulatory Visit: Payer: Self-pay | Admitting: Family Medicine

## 2020-09-13 ENCOUNTER — Telehealth: Payer: Self-pay | Admitting: Family Medicine

## 2020-09-13 NOTE — Telephone Encounter (Signed)
Can we resend in the adderall 10mg  tabs pt called in stating that we sent it in to early on 08/27/20 and the pharmacy cancelled the request. Please advise

## 2020-09-17 NOTE — Telephone Encounter (Signed)
According to the controlled substance data base this was filled on 12/6 and she received #30.  That means it is too early to fill this

## 2020-09-17 NOTE — Telephone Encounter (Signed)
Patient called stating that we refilled the adderall too soon so the pharmacy put it back. It was filled on 08/27/20  30 caps 25mg  0 refills 90 caps 10mg  0 refills

## 2020-09-27 ENCOUNTER — Other Ambulatory Visit: Payer: Self-pay

## 2020-09-27 ENCOUNTER — Telehealth: Payer: Self-pay | Admitting: Family Medicine

## 2020-09-27 MED ORDER — AMPHETAMINE-DEXTROAMPHET ER 25 MG PO CP24: 25.0000 mg | ORAL_CAPSULE | ORAL | 0 refills | Status: DC

## 2020-09-27 NOTE — Telephone Encounter (Signed)
Pt called in asking for a new script on the Adderall XR 25mg . [facility] [address] and can be reached at the home#    Please advise last refill was 08/27/20

## 2020-09-27 NOTE — Telephone Encounter (Signed)
Prescription sent

## 2020-11-02 ENCOUNTER — Telehealth: Payer: Self-pay | Admitting: Family Medicine

## 2020-11-02 NOTE — Telephone Encounter (Signed)
Pt called in asking for a refill on the Adderall 25mg  XR  Last refill date was 09/27/2020   [facility] [address]

## 2020-11-05 NOTE — Telephone Encounter (Signed)
Patient is requesting Adderrall 25mg  XR Lv 12/14/19 Next v n/a Last refill 09/27/20 30 caps 0 refills

## 2020-11-06 MED ORDER — AMPHETAMINE-DEXTROAMPHET ER 25 MG PO CP24: 25.0000 mg | ORAL_CAPSULE | ORAL | 0 refills | Status: DC

## 2020-11-06 NOTE — Telephone Encounter (Signed)
Prescription filled.

## 2020-11-06 NOTE — Addendum Note (Signed)
Addended by: Midge Minium on: 11/06/2020 07:29 AM   Modules accepted: Orders

## 2020-11-25 ENCOUNTER — Encounter: Payer: Self-pay | Admitting: Family Medicine

## 2020-11-26 MED ORDER — DESVENLAFAXINE SUCCINATE ER 50 MG PO TB24: 50.0000 mg | ORAL_TABLET | Freq: Every day | ORAL | 1 refills | Status: DC

## 2020-12-07 ENCOUNTER — Telehealth: Payer: Self-pay | Admitting: Family Medicine

## 2020-12-07 DIAGNOSIS — F988 Other specified behavioral and emotional disorders with onset usually occurring in childhood and adolescence: Secondary | ICD-10-CM

## 2020-12-07 MED ORDER — AMPHETAMINE-DEXTROAMPHET ER 25 MG PO CP24: 25.0000 mg | ORAL_CAPSULE | ORAL | 0 refills | Status: DC

## 2020-12-07 MED ORDER — AMPHETAMINE-DEXTROAMPHETAMINE 10 MG PO TABS: 10.0000 mg | ORAL_TABLET | Freq: Every day | ORAL | 0 refills | Status: DC

## 2020-12-07 NOTE — Telephone Encounter (Signed)
..  Medication Refills  Last OV:  Medication:  Both Adderalls 10 mg & 25 mg. Extended release  Pharmacy:  [facility]  Let patient know to contact pharmacy at the end of the day to make sure medication is ready.   Please notify patient to allow 48-72 hours to process.  Encourage patient to contact the pharmacy for refills or they can request refills through [place] out below:   Last refill:  QTY:  Refill Date:    Other Comments:  Patient states that the 10mg  is usually sent in as a 90 day refill and the 25 mg as a 30 day refill - due to her insurance coverage   Okay for refill?  Please advise.

## 2020-12-07 NOTE — Telephone Encounter (Signed)
Requesting:Adderall XR 25mg  24hr, 10mg  Contract: UDS: Last Visit:12/14/19 Next Visit:n/a Last Refill:11/06/20 30 tabs 0 refills  90 tabs 0 refills  Last refill: 08/27/20 for the 10mg   Please Advise

## 2020-12-07 NOTE — Telephone Encounter (Signed)
Prescription sent as requested.

## 2021-01-09 ENCOUNTER — Telehealth: Payer: Self-pay | Admitting: Family Medicine

## 2021-01-09 MED ORDER — AMPHETAMINE-DEXTROAMPHET ER 25 MG PO CP24: 25.0000 mg | ORAL_CAPSULE | ORAL | 0 refills | Status: DC

## 2021-01-09 NOTE — Telephone Encounter (Signed)
Pt called in asking for a refill on the adderall 25mg  ER, [facility] [address].  Please advise

## 2021-01-09 NOTE — Telephone Encounter (Signed)
Will provide this refill but pt has not been seen in over 1 yr.  Needs to schedule an appt in order for refills to continue

## 2021-01-09 NOTE — Telephone Encounter (Signed)
LFD 12/07/20 #30 with no refills LOV 12/14/19 NOV none

## 2021-01-10 NOTE — Telephone Encounter (Signed)
Left a vm message for the patient to call the office back to schedule an appointment for further refills.

## 2021-02-07 ENCOUNTER — Telehealth: Payer: Self-pay | Admitting: Family Medicine

## 2021-02-07 NOTE — Telephone Encounter (Signed)
We have her XR dose listed as 25mg .  Does she want to go back down to 20mg  or was this a mistake?

## 2021-02-07 NOTE — Telephone Encounter (Signed)
Pt called in asking for a refill on the adderall 20 mg XR, [facility] [address]

## 2021-02-07 NOTE — Telephone Encounter (Signed)
Called patient, she states it is supposed to be 25mg .

## 2021-02-08 MED ORDER — AMPHETAMINE-DEXTROAMPHET ER 25 MG PO CP24: 25.0000 mg | ORAL_CAPSULE | ORAL | 0 refills | Status: DC

## 2021-02-08 NOTE — Telephone Encounter (Signed)
Prescription sent to pharmacy.

## 2021-03-11 ENCOUNTER — Telehealth: Payer: Self-pay | Admitting: Family Medicine

## 2021-03-11 MED ORDER — AMPHETAMINE-DEXTROAMPHET ER 25 MG PO CP24: 25.0000 mg | ORAL_CAPSULE | ORAL | 0 refills | Status: DC

## 2021-03-11 NOTE — Telephone Encounter (Signed)
We are not able to fill at this time.  Pt was told in April that she needed to make an appt in order to continue to get meds

## 2021-03-11 NOTE — Telephone Encounter (Signed)
Prescription filled at pt's request but this will be last refill without appt

## 2021-03-11 NOTE — Telephone Encounter (Signed)
Patient is scheduled on 7/7 at 2 for an in office visit. Is it ok to fill her Adderall or wait unitl patient comes in for visit? Please advise

## 2021-03-11 NOTE — Telephone Encounter (Signed)
Pt called in asking for new scripts of the Adderall 25mg    And   A 90 day supply of Adderall on the 10 mg tabs   Pt uses [facility] [address] in [place],  Please advise

## 2021-03-11 NOTE — Telephone Encounter (Signed)
Requesting:Adderall XR 25mg  Contract: UDS: Last Visit:12/14/19 Next Visit:n/a Last Refill:12/07/20 90 tabs 0 refills  Please Advise

## 2021-03-11 NOTE — Addendum Note (Signed)
Addended by: Midge Minium on: 03/11/2021 03:42 PM   Modules accepted: Orders

## 2021-03-20 ENCOUNTER — Encounter: Payer: Self-pay | Admitting: *Deleted

## 2021-03-28 ENCOUNTER — Encounter: Payer: Self-pay | Admitting: Family Medicine

## 2021-03-28 ENCOUNTER — Ambulatory Visit: Payer: 59 | Admitting: Family Medicine

## 2021-03-28 ENCOUNTER — Other Ambulatory Visit: Payer: Self-pay

## 2021-03-28 VITALS — BP 120/82 | HR 90 | Temp 97.9°F | Resp 19 | Ht 69.0 in | Wt 261.0 lb

## 2021-03-28 DIAGNOSIS — F988 Other specified behavioral and emotional disorders with onset usually occurring in childhood and adolescence: Secondary | ICD-10-CM

## 2021-03-28 DIAGNOSIS — E669 Obesity, unspecified: Secondary | ICD-10-CM | POA: Diagnosis not present

## 2021-03-28 DIAGNOSIS — F32A Depression, unspecified: Secondary | ICD-10-CM

## 2021-03-28 DIAGNOSIS — G8929 Other chronic pain: Secondary | ICD-10-CM

## 2021-03-28 DIAGNOSIS — M25561 Pain in right knee: Secondary | ICD-10-CM | POA: Diagnosis not present

## 2021-03-28 MED ORDER — DESVENLAFAXINE SUCCINATE ER 50 MG PO TB24: 50.0000 mg | ORAL_TABLET | Freq: Every day | ORAL | 1 refills | Status: DC

## 2021-03-28 MED ORDER — AMPHETAMINE-DEXTROAMPHETAMINE 10 MG PO TABS: 10.0000 mg | ORAL_TABLET | Freq: Every day | ORAL | 0 refills | Status: DC

## 2021-03-28 NOTE — Patient Instructions (Addendum)
Schedule your complete physical in 3-4 months We'll notify you of your lab results and make any changes if needed Continue to work on healthy diet and regular exercise- you can do it! We'll call you with your Ortho appt for the knee DECREASE the Pristiq to 50mg  daily and use the 100mg  daily around your period Continue to ice and take Ibuprofen for the knee pain Call with any questions or concerns Hang in there!  You've got this!!

## 2021-03-28 NOTE — Progress Notes (Signed)
   Subjective:    Patient ID: Kristi Hill, female    DOB: [birth year], 50 y.o.   MRN: [id number]  HPI Depression- pt reports she would like to decrease her Pristiq b/c at current level she feels she's in 'this I don't give a crap state'.  Regular exercise helps her mood.  Pt reports she is able to better control her mood unless it's around her menstrual cycle.  ADHD- chronic problem, on Adderall XR 25mg  daily and Adderall 10mg  as needed for additional control.  R Knee pain- was on treadmill ~1 yr ago and twisted.  She 'felt something' but did not have any pain.  Over the next few days developed swelling and now she can no longer straighten her leg.  She had gotten her leg much straighter and was walking w/o difficulty until she moved her daughter over the weekend.  Knee is again painful and was swollen.  Has been using ibuprofen and topical pain meds.  Obesity- pt has gained 30 lbs since the last year.  Eats a low carb diet and walks regularly but is frustrated w/ inability to lose weight.   Review of Systems For ROS see HPI   This visit occurred during the SARS-CoV-2 public health emergency.  Safety protocols were in place, including screening questions prior to the visit, additional usage of staff PPE, and extensive cleaning of exam room while observing appropriate contact time as indicated for disinfecting solutions.      Objective:   Physical Exam Vitals reviewed.  Constitutional:      General: She is not in acute distress.    Appearance: She is obese. She is not ill-appearing.  HENT:     Head: Normocephalic and atraumatic.  Eyes:     Extraocular Movements: Extraocular movements intact.     Conjunctiva/sclera: Conjunctivae normal.     Pupils: Pupils are equal, round, and reactive to light.  Cardiovascular:     Rate and Rhythm: Normal rate and regular rhythm.     Pulses: Normal pulses.  Pulmonary:     Effort: Pulmonary effort is normal.     Breath sounds: Normal breath  sounds.  Skin:    General: Skin is warm and dry.  Neurological:     General: No focal deficit present.     Mental Status: She is alert and oriented to person, place, and time.     Cranial Nerves: No cranial nerve deficit.     Motor: No weakness.  Psychiatric:        Mood and Affect: Mood normal.        Behavior: Behavior normal.        Thought Content: Thought content normal.          Assessment & Plan:   Chronic pain of R knee- new to provider.  Pt twisted knee on treadmill ~1 yr ago.  Over the next few days she developed pain, swelling, and inability to straighten leg.  This sounds suspicious for a meniscus injury.  Things were doing better until this weekend when she was helping her daughter move and going up and down the stairs repeatedly.  She is again having pain, swelling, and sense of instability.  Refer to ortho for complete evaluation and tx.  Pt expressed understanding and is in agreement w/ plan.

## 2021-03-29 ENCOUNTER — Telehealth: Payer: Self-pay

## 2021-03-29 ENCOUNTER — Other Ambulatory Visit: Payer: Self-pay

## 2021-03-29 DIAGNOSIS — R7989 Other specified abnormal findings of blood chemistry: Secondary | ICD-10-CM

## 2021-03-29 LAB — LIPID PANEL
Cholesterol: 147 mg/dL (ref 0–200)
HDL: 58 mg/dL (ref >39.00)
LDL Cholesterol: 69 mg/dL (ref 0–99)
NonHDL: 88.63
Total CHOL/HDL Ratio: 3
Triglycerides: 98 mg/dL (ref 0.0–149.0)
VLDL: 19.6 mg/dL (ref 0.0–40.0)

## 2021-03-29 LAB — HEPATIC FUNCTION PANEL
ALT: 9 U/L (ref 0–35)
AST: 19 U/L (ref 0–37)
Albumin: 4.1 g/dL (ref 3.5–5.2)
Alkaline Phosphatase: 48 U/L (ref 39–117)
Bilirubin, Direct: 0.1 mg/dL (ref 0.0–0.3)
Total Bilirubin: 0.5 mg/dL (ref 0.2–1.2)
Total Protein: 6.8 g/dL (ref 6.0–8.3)

## 2021-03-29 LAB — BASIC METABOLIC PANEL
BUN: 19 mg/dL (ref 6–23)
CO2: 27 mEq/L (ref 19–32)
Calcium: 9.2 mg/dL (ref 8.4–10.5)
Chloride: 101 mEq/L (ref 96–112)
Creatinine, Ser: 1.02 mg/dL (ref 0.40–1.20)
GFR: 65.21 mL/min (ref >60.00)
Glucose, Bld: 84 mg/dL (ref 70–99)
Potassium: 3.9 mEq/L (ref 3.5–5.1)
Sodium: 137 mEq/L (ref 135–145)

## 2021-03-29 LAB — CBC WITH DIFFERENTIAL/PLATELET
Basophils Absolute: 0.1 10*3/uL (ref 0.0–0.1)
Basophils Relative: 0.7 % (ref 0.0–3.0)
Eosinophils Absolute: 0.1 10*3/uL (ref 0.0–0.7)
Eosinophils Relative: 1 % (ref 0.0–5.0)
HCT: 40.6 % (ref 36.0–46.0)
Hemoglobin: 13.9 g/dL (ref 12.0–15.0)
Lymphocytes Relative: 24.2 % (ref 12.0–46.0)
Lymphs Abs: 1.7 10*3/uL (ref 0.7–4.0)
MCHC: 34.2 g/dL (ref 30.0–36.0)
MCV: 93.6 fl (ref 78.0–100.0)
Monocytes Absolute: 0.6 10*3/uL (ref 0.1–1.0)
Monocytes Relative: 8.9 % (ref 3.0–12.0)
Neutro Abs: 4.7 10*3/uL (ref 1.4–7.7)
Neutrophils Relative %: 65.2 % (ref 43.0–77.0)
Platelets: 306 10*3/uL (ref 150.0–400.0)
RBC: 4.33 Mil/uL (ref 3.87–5.11)
RDW: 13.1 % (ref 11.5–15.5)
WBC: 7.2 10*3/uL (ref 4.0–10.5)

## 2021-03-29 LAB — VITAMIN D 25 HYDROXY (VIT D DEFICIENCY, FRACTURES): VITD: >120 ng/mL

## 2021-03-29 LAB — T4, FREE: Free T4: 0.84 ng/dL (ref 0.60–1.60)

## 2021-03-29 LAB — TSH: TSH: 0.68 u[IU]/mL (ref 0.35–5.50)

## 2021-03-29 LAB — T3, FREE: T3, Free: 4 pg/mL (ref 2.3–4.2)

## 2021-03-29 NOTE — Telephone Encounter (Signed)
Hope from lab called and stated that there is a critical lab on patient. Vitamin D is greater than 120

## 2021-03-29 NOTE — Telephone Encounter (Signed)
Addressed in result note.  

## 2021-04-03 NOTE — Assessment & Plan Note (Signed)
Pt feels that sxs are adequately controlled on current medication.  No med changes at this time.  Will follow.

## 2021-04-03 NOTE — Assessment & Plan Note (Signed)
Pt was previously at [place] 100mg  daily.  Feels she wants to go back to 50mg  daily b/c at the higher dose, she feels almost emotionless.  She feels exercise improves her mood and that for the most part, mood is well controlled w/ exception of around her menstrual cycle.  Agreed that she will decrease Pristiq to 50mg  and only use the 100mg  when she is having PMS.  Pt expressed understanding and is in agreement w/ plan.

## 2021-04-03 NOTE — Assessment & Plan Note (Signed)
Deteriorated.  Pt has gained 30 lbs in the last year.  She reports she has started eating a low carb diet and is now walking regularly but is frustrated by her inability to lose weight.  Discussed that a lot of this is likely hormonal.  Check labs to r/o thyroid issue or other metabolic cause.  Will follow.

## 2021-04-08 ENCOUNTER — Telehealth: Payer: Self-pay

## 2021-04-08 ENCOUNTER — Other Ambulatory Visit: Payer: Self-pay

## 2021-04-08 MED ORDER — AMPHETAMINE-DEXTROAMPHET ER 25 MG PO CP24: 25.0000 mg | ORAL_CAPSULE | ORAL | 0 refills | Status: DC

## 2021-04-08 NOTE — Telephone Encounter (Signed)
Request sent to provider for approval.  

## 2021-04-08 NOTE — Telephone Encounter (Signed)
Pt needs refill on amphetamine-dextroamphetamine (ADDERALL XR) 25 MG 24 hr capsule [[id number]]    [id number] [place] [place] [address]  [place], [place] [address]   Pt call back [phone]

## 2021-04-08 NOTE — Telephone Encounter (Signed)
  Pt needs refill on amphetamine-dextroamphetamine (ADDERALL XR) 25 MG 24 hr capsule [[id number]]     [id number] [place] [place] [address]  [place], [place] [address]      Last filled 03/11/21 #30 with no refills Last visit 03/28/21 Next visit none

## 2021-05-07 ENCOUNTER — Other Ambulatory Visit: Payer: Self-pay

## 2021-05-07 ENCOUNTER — Telehealth: Payer: Self-pay

## 2021-05-07 MED ORDER — AMPHETAMINE-DEXTROAMPHET ER 25 MG PO CP24: 25.0000 mg | ORAL_CAPSULE | ORAL | 0 refills | Status: DC

## 2021-05-07 NOTE — Telephone Encounter (Signed)
Sent to provider for approval

## 2021-05-07 NOTE — Telephone Encounter (Signed)
Pt needs refill on amphetamine-dextroamphetamine (ADDERALL XR) 25 MG 24 hr capsule [id number]   [id number] [place] [place] [id number]  Pt call back [phone]

## 2021-05-07 NOTE — Telephone Encounter (Signed)
Pt needs refill on amphetamine-dextroamphetamine (ADDERALL XR) 25 MG 24 hr capsule [id number]    [id number] [place]  [id number]   Pt call back [phone] LFD 04/08/21 #30 with no refills LOV 7/none7/22 NOV

## 2021-06-07 ENCOUNTER — Other Ambulatory Visit: Payer: Self-pay

## 2021-06-07 ENCOUNTER — Telehealth: Payer: Self-pay | Admitting: Family Medicine

## 2021-06-07 NOTE — Telephone Encounter (Signed)
Rx has been sent to provider for approval

## 2021-06-07 NOTE — Telephone Encounter (Signed)
Requesting:Adderall '25mg'$  Contract: UDS: Last Visit:03/28/21 Next Visit:n/a Last Refill:05/07/21 30 tabs 0 refills  Please Advise

## 2021-06-07 NOTE — Telephone Encounter (Signed)
Patient needs to get her Adderall 25 mg. Extended release filled - [facility].

## 2021-06-10 MED ORDER — AMPHETAMINE-DEXTROAMPHET ER 25 MG PO CP24: 25.0000 mg | ORAL_CAPSULE | ORAL | 0 refills | Status: DC

## 2021-06-16 ENCOUNTER — Other Ambulatory Visit: Payer: Self-pay | Admitting: Family Medicine

## 2021-06-28 ENCOUNTER — Other Ambulatory Visit: Payer: Self-pay

## 2021-06-28 DIAGNOSIS — F988 Other specified behavioral and emotional disorders with onset usually occurring in childhood and adolescence: Secondary | ICD-10-CM

## 2021-06-28 NOTE — Telephone Encounter (Signed)
Caller name:Karessa Public house manager callback [phone]  Encourage patient to contact the pharmacy for refills or they can request refills through [facility]:  03/28/2021 NEXT APPOINTMENT DATE: No future appt   MEDICATION NAME & DOSE:amphetamine-dextroamphetamine (ADDERALL) 10 MG tablet [[id number]]   Is the patient out of medication? 2 days left   [id number] [place] [place] [address]  [place], [place] [address]   Let patient know to contact pharmacy at the end of the day to make sure medication is ready.  Please notify patient to allow 48-72 hours to process  (CLINICAL TO FILL OR ROUTE PER PROTOCOLS)

## 2021-06-28 NOTE — Telephone Encounter (Signed)
Requesting:Adderall 10mg  Contract: UDS: Last Visit:03/28/21 Next Visit:n/a Last Refill:03/28/21 90 tabs 0 refills  Please Advise

## 2021-07-01 MED ORDER — AMPHETAMINE-DEXTROAMPHETAMINE 10 MG PO TABS: 10.0000 mg | ORAL_TABLET | Freq: Every day | ORAL | 0 refills | Status: DC

## 2021-07-11 ENCOUNTER — Other Ambulatory Visit: Payer: Self-pay

## 2021-07-11 MED ORDER — AMPHETAMINE-DEXTROAMPHET ER 25 MG PO CP24: 25.0000 mg | ORAL_CAPSULE | ORAL | 0 refills | Status: DC

## 2021-07-11 NOTE — Telephone Encounter (Signed)
Requesting:Adderall 25mg  Contract: UDS: Last Visit:03/28/21 Next Visit:n/a Last Refill:06/10/21  30 tabs 0 refills  Please Advise

## 2021-07-11 NOTE — Telephone Encounter (Signed)
Caller name:Ada Aeronautical engineer callback [phone]  Encourage patient to contact the pharmacy for refills or they can request refills through [facility]  (Please schedule appointment if patient has not been seen in over a year)  MEDICATION NAME & DOSE: amphetamine-dextroamphetamine (ADDERALL XR) 25 MG 24 hr capsule [[id number]]  Notes/Comments from patient: No  WHAT [place] TO: [id number] [place], [place] [address]  [place], [place] [address]   Please notify patient: It takes 48-72 hours to process rx refill requests Ask patient to call pharmacy to ensure rx is ready before heading there.   (CLINICAL TO FILL OR ROUTE PER PROTOCOLS)

## 2021-07-24 ENCOUNTER — Telehealth: Payer: Self-pay

## 2021-07-24 MED ORDER — VALACYCLOVIR HCL 500 MG PO TABS: ORAL_TABLET | ORAL | 3 refills | Status: DC

## 2021-07-24 NOTE — Telephone Encounter (Signed)
Caller name:Karena Simien   Caller callback [phone]  Encourage patient to contact the pharmacy for refills or they can request refills through [facility]  (Please schedule appointment if patient has not been seen in over a year)  MEDICATION NAME & DOSE: Valtrex 500 mg   Notes/Comments from patient:Pt has not had this RX since 2012 I told her I don't know if it could be filled with out being seen?   WHAT PHARMACY WOULD THEY LIKE THIS SENT TO: [facility] [place]   Please notify patient: It takes 48-72 hours to process rx refill requests Ask patient to call pharmacy to ensure rx is ready before heading there.   (CLINICAL TO FILL OR ROUTE PER PROTOCOLS)

## 2021-07-24 NOTE — Telephone Encounter (Signed)
Valtrex sent at pt's request

## 2021-07-24 NOTE — Telephone Encounter (Signed)
Called and spoke with patient and patient stated that she has been under a lot of stress with her job and have not had to have any medication in a long time but she felt one coming on yesterday and then today it popped out. She stated that she do like to keep them on hand just in case. Ok to fill? Please advise

## 2021-07-24 NOTE — Telephone Encounter (Signed)
Called and notified patient of Rx sent

## 2021-08-12 ENCOUNTER — Telehealth: Payer: Self-pay | Admitting: Family Medicine

## 2021-08-12 DIAGNOSIS — F988 Other specified behavioral and emotional disorders with onset usually occurring in childhood and adolescence: Secondary | ICD-10-CM

## 2021-08-12 MED ORDER — AMPHETAMINE-DEXTROAMPHET ER 25 MG PO CP24: 25.0000 mg | ORAL_CAPSULE | ORAL | 0 refills | Status: DC

## 2021-08-12 NOTE — Telephone Encounter (Signed)
..  Caller name: Arretta Toenjes Caller callback [phone]  Encourage patient to contact the pharmacy for refills or they can request refills through [facility]  (Please schedule appointment if patient has not been seen in over a year)  MEDICATION NAME & DOSE:  Adderall xr 25 mg.  Notes/Comments from patient: (Patient has only 1 remaining pill)  WHAT PHARMACY WOULD THEY LIKE THIS SENT TO: [facility] [address]  Please notify patient: It takes 48-72 hours to process rx refill requests Ask patient to call pharmacy to ensure rx is ready before heading there.   (CLINICAL TO FILL OR ROUTE PER PROTOCOLS)

## 2021-08-12 NOTE — Telephone Encounter (Signed)
Last note with Dr. Birdie Riddle noted, continued on same dose Adderall: ADHD- chronic problem, on Adderall XR 25mg  daily and Adderall 10mg  as needed for additional control. Controlled substance database (PDMP) reviewed. No concerns appreciated.  Last rx for Adderall 10mg  #90 on 10/10, Adderall XR 25mg  #30 on 10/20.  Adderall XR refilled in Dr. Karren Burly absence.

## 2021-09-12 ENCOUNTER — Telehealth: Payer: Self-pay | Admitting: Family Medicine

## 2021-09-12 DIAGNOSIS — F988 Other specified behavioral and emotional disorders with onset usually occurring in childhood and adolescence: Secondary | ICD-10-CM

## 2021-09-12 MED ORDER — AMPHETAMINE-DEXTROAMPHET ER 25 MG PO CP24: 25.0000 mg | ORAL_CAPSULE | ORAL | 0 refills | Status: DC

## 2021-09-12 NOTE — Telephone Encounter (Signed)
Prescription filled at pt's request ?

## 2021-09-12 NOTE — Telephone Encounter (Signed)
Pt called in asking for a refill on the Adderall XR 25MG , [facility] [address]   Last script was 08/12/21

## 2021-09-24 ENCOUNTER — Other Ambulatory Visit: Payer: Self-pay | Admitting: Family Medicine

## 2021-10-02 ENCOUNTER — Telehealth: Payer: Self-pay

## 2021-10-02 DIAGNOSIS — F988 Other specified behavioral and emotional disorders with onset usually occurring in childhood and adolescence: Secondary | ICD-10-CM

## 2021-10-02 NOTE — Telephone Encounter (Signed)
Caller name:Jossalyn Forensic psychologist callback [phone]  Encourage patient to contact the pharmacy for refills or they can request refills through [facility]  (Please schedule appointment if patient has not been seen in over a year)  MEDICATION NAME & DOSE:amphetamine-dextroamphetamine (ADDERALL) 10 MG tablet   Notes/Comments from patient:  [place] [facility]: [id number] [place] [place] [address]  [place], [place] [address]   Please notify patient: It takes 48-72 hours to process rx refill requests Ask patient to call pharmacy to ensure rx is ready before heading there.   (CLINICAL TO FILL OR ROUTE PER PROTOCOLS)

## 2021-10-03 MED ORDER — AMPHETAMINE-DEXTROAMPHETAMINE 10 MG PO TABS: 10.0000 mg | ORAL_TABLET | Freq: Every day | ORAL | 0 refills | Status: DC

## 2021-10-03 NOTE — Telephone Encounter (Signed)
Prescription sent.  Sig edited to show that this is last refill w/o CPE

## 2021-10-14 ENCOUNTER — Other Ambulatory Visit: Payer: Self-pay | Admitting: Family Medicine

## 2021-10-14 DIAGNOSIS — F988 Other specified behavioral and emotional disorders with onset usually occurring in childhood and adolescence: Secondary | ICD-10-CM

## 2021-10-14 NOTE — Telephone Encounter (Signed)
I called and lvm for patient to call us back. An appt needs to be scheduled for CPE before we are able to provide rx refills.

## 2021-10-14 NOTE — Telephone Encounter (Signed)
Pt called in asking for a refill on the Adderall 25 mg tablets pt [facility] [address]

## 2021-10-16 NOTE — Telephone Encounter (Signed)
Pt is scheduled for a cpe on 11/28/21 with Tabori, can we send in a refill on the adderrall 25 mg tabs.  Please advise

## 2021-10-17 MED ORDER — AMPHETAMINE-DEXTROAMPHET ER 25 MG PO CP24: 25.0000 mg | ORAL_CAPSULE | ORAL | 0 refills | Status: DC

## 2021-10-17 NOTE — Telephone Encounter (Signed)
Patient is requesting a refill of the following medications: Requested Prescriptions   Pending Prescriptions Disp Refills   amphetamine-dextroamphetamine (ADDERALL XR) 25 MG 24 hr capsule 30 capsule 0    Sig: Take 1 capsule by mouth every morning.    Date of patient request: 10/14/2021 Last office visit: 03/28/2021 Date of last refill: 09/12/2021 Last refill amount: 30 capsules Follow up time period per chart: 11/28/2021

## 2021-10-30 ENCOUNTER — Other Ambulatory Visit: Payer: Self-pay | Admitting: Family Medicine

## 2021-11-07 ENCOUNTER — Telehealth: Payer: Self-pay | Admitting: Family Medicine

## 2021-11-07 NOTE — Telephone Encounter (Signed)
Pt called in stating that the Pristiq 50 MG ER is not longer available, she is needing the brand name of Pristiq only called in for either 50 mgs 2 times a day or 100 mg  once a day.   She prefers the 50 mg 2 times a day.   [facility] [address]   Only has one more pill, the pharmacy has been out of the medication and has been trying to order it for her.   She states that in order for the pharmacy to get the medication in tomorrow a script needs to be sent in today.

## 2021-11-08 NOTE — Telephone Encounter (Signed)
I am very confused by this message b/c Pristiq only comes in extended release- it is a once daily medication.  It is not recommended that this be dosed twice daily and a 100mg  dose has not been found to be more effective than the 50mg  dose.  I have not heard that this medication is not available.  She may need to try a different pharmacy

## 2021-11-08 NOTE — Telephone Encounter (Signed)
Spoke to pharmacy and patient, clarified issue. Per pharmacy they had to make a change on their end but should be able to order it. Patient made aware

## 2021-11-19 ENCOUNTER — Telehealth: Payer: Self-pay | Admitting: Family Medicine

## 2021-11-19 DIAGNOSIS — F988 Other specified behavioral and emotional disorders with onset usually occurring in childhood and adolescence: Secondary | ICD-10-CM

## 2021-11-19 NOTE — Telephone Encounter (Signed)
Pt called stating that she needs a refill of her adderall 25mg . She states that she would [facility].  Please advice  Thank You

## 2021-11-20 MED ORDER — AMPHETAMINE-DEXTROAMPHET ER 25 MG PO CP24: 25.0000 mg | ORAL_CAPSULE | ORAL | 0 refills | Status: DC

## 2021-11-20 NOTE — Telephone Encounter (Signed)
Prescription sent

## 2021-11-28 ENCOUNTER — Encounter: Payer: No Typology Code available for payment source | Admitting: Family Medicine

## 2021-12-09 ENCOUNTER — Other Ambulatory Visit: Payer: Self-pay | Admitting: Family Medicine

## 2022-01-06 ENCOUNTER — Telehealth: Payer: Self-pay

## 2022-01-06 DIAGNOSIS — F988 Other specified behavioral and emotional disorders with onset usually occurring in childhood and adolescence: Secondary | ICD-10-CM

## 2022-01-06 MED ORDER — AMPHETAMINE-DEXTROAMPHET ER 25 MG PO CP24: 25.0000 mg | ORAL_CAPSULE | ORAL | 0 refills | Status: DC

## 2022-01-06 MED ORDER — AMPHETAMINE-DEXTROAMPHETAMINE 10 MG PO TABS: 10.0000 mg | ORAL_TABLET | Freq: Every day | ORAL | 0 refills | Status: DC

## 2022-01-06 NOTE — Telephone Encounter (Signed)
MEDICATION: amphetamine-dextroamphetamine (ADDERALL) 10 MG tablet -- amphetamine-dextroamphetamine (ADDERALL XR) 25 MG 24 hr capsule ? ?[id number] [place]? ?Comments: Patient has a days worth of medication  ? ?**Let patient know to contact pharmacy at the end of the day to make sure medication is ready. ** ? ?** Please notify patient to allow 48-72 hours to process** ? ?**Encourage patient to contact the pharmacy for refills or they can [facility]* ?  ?

## 2022-01-06 NOTE — Telephone Encounter (Signed)
Both prescriptions filled at pt's request ?

## 2022-01-15 ENCOUNTER — Encounter: Payer: No Typology Code available for payment source | Admitting: Family Medicine

## 2022-02-19 ENCOUNTER — Encounter: Payer: Self-pay | Admitting: Family Medicine

## 2022-02-19 ENCOUNTER — Ambulatory Visit (INDEPENDENT_AMBULATORY_CARE_PROVIDER_SITE_OTHER): Payer: No Typology Code available for payment source | Admitting: Family Medicine

## 2022-02-19 VITALS — BP 144/96 | HR 97 | Temp 97.0°F | Ht 69.0 in | Wt 263.0 lb

## 2022-02-19 DIAGNOSIS — Z1211 Encounter for screening for malignant neoplasm of colon: Secondary | ICD-10-CM

## 2022-02-19 DIAGNOSIS — E669 Obesity, unspecified: Secondary | ICD-10-CM | POA: Diagnosis not present

## 2022-02-19 DIAGNOSIS — Z0000 Encounter for general adult medical examination without abnormal findings: Secondary | ICD-10-CM | POA: Diagnosis not present

## 2022-02-19 DIAGNOSIS — R2242 Localized swelling, mass and lump, left lower limb: Secondary | ICD-10-CM

## 2022-02-19 LAB — HEPATIC FUNCTION PANEL
ALT: 11 U/L (ref 0–35)
AST: 24 U/L (ref 0–37)
Albumin: 4.3 g/dL (ref 3.5–5.2)
Alkaline Phosphatase: 60 U/L (ref 39–117)
Bilirubin, Direct: 0.1 mg/dL (ref 0.0–0.3)
Total Bilirubin: 0.6 mg/dL (ref 0.2–1.2)
Total Protein: 7 g/dL (ref 6.0–8.3)

## 2022-02-19 LAB — CBC WITH DIFFERENTIAL/PLATELET
Basophils Absolute: 0 10*3/uL (ref 0.0–0.1)
Basophils Relative: 0.6 % (ref 0.0–3.0)
Eosinophils Absolute: 0.1 10*3/uL (ref 0.0–0.7)
Eosinophils Relative: 1.6 % (ref 0.0–5.0)
HCT: 41.8 % (ref 36.0–46.0)
Hemoglobin: 14.2 g/dL (ref 12.0–15.0)
Lymphocytes Relative: 25.9 % (ref 12.0–46.0)
Lymphs Abs: 1.7 10*3/uL (ref 0.7–4.0)
MCHC: 34 g/dL (ref 30.0–36.0)
MCV: 93.1 fl (ref 78.0–100.0)
Monocytes Absolute: 0.7 10*3/uL (ref 0.1–1.0)
Monocytes Relative: 11.3 % (ref 3.0–12.0)
Neutro Abs: 4 10*3/uL (ref 1.4–7.7)
Neutrophils Relative %: 60.6 % (ref 43.0–77.0)
Platelets: 281 10*3/uL (ref 150.0–400.0)
RBC: 4.49 Mil/uL (ref 3.87–5.11)
RDW: 13.4 % (ref 11.5–15.5)
WBC: 6.6 10*3/uL (ref 4.0–10.5)

## 2022-02-19 LAB — BASIC METABOLIC PANEL
BUN: 17 mg/dL (ref 6–23)
CO2: 29 mEq/L (ref 19–32)
Calcium: 9.4 mg/dL (ref 8.4–10.5)
Chloride: 100 mEq/L (ref 96–112)
Creatinine, Ser: 1.05 mg/dL (ref 0.40–1.20)
GFR: 62.59 mL/min (ref >60.00)
Glucose, Bld: 82 mg/dL (ref 70–99)
Potassium: 4.3 mEq/L (ref 3.5–5.1)
Sodium: 137 mEq/L (ref 135–145)

## 2022-02-19 LAB — LIPID PANEL
Cholesterol: 161 mg/dL (ref 0–200)
HDL: 64.2 mg/dL (ref >39.00)
LDL Cholesterol: 70 mg/dL (ref 0–99)
NonHDL: 96.91
Total CHOL/HDL Ratio: 3
Triglycerides: 134 mg/dL (ref 0.0–149.0)
VLDL: 26.8 mg/dL (ref 0.0–40.0)

## 2022-02-19 LAB — TSH: TSH: 0.75 u[IU]/mL (ref 0.35–5.50)

## 2022-02-19 LAB — VITAMIN D 25 HYDROXY (VIT D DEFICIENCY, FRACTURES): VITD: 98.21 ng/mL (ref 30.00–100.00)

## 2022-02-19 NOTE — Progress Notes (Signed)
   Subjective:    Patient ID: Kristi Hill, female    DOB: 06-14-1971, 51 y.o.   MRN: [id number]  HPI CPE- due for colonoscopy.  UTD on mammo, pap.  Patient Care Team    Relationship Specialty Notifications Start End  Midge Minium, MD PCP - General Family Medicine  01/18/15   Artelia Laroche, [place] Midwife Obstetrics and Gynecology  01/18/15     Health Maintenance  Topic Date Due   COVID-19 Vaccine (1) Never done   COLONOSCOPY (Pts 45-26yr Insurance coverage will need to be confirmed)  Never done   MAMMOGRAM  08/25/2018   PAP SMEAR-Modifier  07/23/2020   Hepatitis C Screening  03/28/2022 (Originally 01/29/1991)   HIV Screening  03/28/2022 (Originally 01/29/1988)   INFLUENZA VACCINE  04/22/2022   TETANUS/TDAP  12/13/2029   HPV VACCINES  Aged Out      Review of Systems Patient reports no vision/ hearing changes, adenopathy, fever, weight change,  persistant/recurrent hoarseness , swallowing issues, chest pain, palpitations, edema, persistant/recurrent cough, hemoptysis, dyspnea (rest/exertional/paroxysmal nocturnal), gastrointestinal bleeding (melena, rectal bleeding), abdominal pain, bowel changes, GU symptoms (dysuria, hematuria, incontinence), Gyn symptoms (abnormal  bleeding, pain),  syncope, focal weakness, memory loss, numbness & tingling, skin/hair/nail changes, abnormal bruising or bleeding, anxiety, or depression.   + elevated BP- pt states she is under stress today.  Work related. + GERD- pt reports she will have intermittent flares.  Can typically be controlled w/ OTC Pepcid + L medial thigh mass    Objective:   Physical Exam General Appearance:    Alert, cooperative, no distress, appears stated age  Head:    Normocephalic, without obvious abnormality, atraumatic  Eyes:    PERRL, conjunctiva/corneas clear, EOM's intact both eyes  Ears:    Normal TM's and external ear canals, both ears  Nose:   Nares normal, septum midline, mucosa normal, no drainage    or sinus  tenderness  Throat:   Lips, mucosa, and tongue normal; teeth and gums normal  Neck:   Supple, symmetrical, trachea midline, no adenopathy;    Thyroid: no enlargement/tenderness/nodules  Back:     Symmetric, no curvature, ROM normal, no CVA tenderness  Lungs:     Clear to auscultation bilaterally, respirations unlabored  Chest Wall:    No tenderness or deformity   Heart:    Regular rate and rhythm, S1 and S2 normal, no murmur, rub   or gallop  Breast Exam:    Deferred to GYN  Abdomen:     Soft, non-tender, bowel sounds active all four quadrants,    no masses, no organomegaly  Genitalia:    Deferred to GYN  Rectal:    Extremities:   Extremities normal, atraumatic, no cyanosis or edema.  L medial thigh soft tissue mass, no TTP  Pulses:   2+ and symmetric all extremities  Skin:   Skin color, texture, turgor normal, no rashes or lesions  Lymph nodes:   Cervical, supraclavicular, and axillary nodes normal  Neurologic:   CNII-XII intact, normal strength, sensation and reflexes    throughout          Assessment & Plan:

## 2022-02-19 NOTE — Assessment & Plan Note (Signed)
Ongoing issue for pt.  BMI 38.84  She knows she needs to lose weight to improve some of her current issues- elevated BP, GERD.  Encouraged low carb diet, regular exercise.  Check labs to risk stratify.  Will follow.

## 2022-02-19 NOTE — Patient Instructions (Signed)
Follow up in 6-8 weeks to recheck BP We'll notify you of your lab results and make any changes if needed Continue to work on healthy diet and regular exercise- you can do it! We'll call you with your ultrasound appt to look at the leg Use OTC meds as needed for reflux and stomach issues.  If not working, let me know! Call with any questions or concerns Hang in there!!!

## 2022-02-19 NOTE — Assessment & Plan Note (Signed)
Pt's PE WNL w/ exception of obesity, new L medial thigh soft tissue mass, and elevated BP.  UTD on pap and mammo.  Due for colonoscopy- referral placed.  Will get [place] of soft tissue mass- suspect lipoma but will confirm.  Pt will return in a few weeks to recheck BP as pt feels this is stress related.  Check labs.  Anticipatory guidance provided.

## 2022-02-20 ENCOUNTER — Telehealth (HOSPITAL_BASED_OUTPATIENT_CLINIC_OR_DEPARTMENT_OTHER): Payer: Self-pay

## 2022-03-06 ENCOUNTER — Telehealth: Payer: Self-pay | Admitting: Family Medicine

## 2022-03-06 DIAGNOSIS — F988 Other specified behavioral and emotional disorders with onset usually occurring in childhood and adolescence: Secondary | ICD-10-CM

## 2022-03-06 MED ORDER — AMPHETAMINE-DEXTROAMPHET ER 25 MG PO CP24: 25.0000 mg | ORAL_CAPSULE | ORAL | 0 refills | Status: DC

## 2022-03-06 MED ORDER — AMPHETAMINE-DEXTROAMPHETAMINE 10 MG PO TABS: 10.0000 mg | ORAL_TABLET | Freq: Every day | ORAL | 0 refills | Status: DC

## 2022-03-06 NOTE — Telephone Encounter (Signed)
PT stats she needs a refill on her amphetamine-dexamphetamine 25 mg.[facility] [address] [facility] pharmacy

## 2022-03-06 NOTE — Telephone Encounter (Signed)
Medications refilled at pt's request

## 2022-04-09 ENCOUNTER — Encounter: Payer: Self-pay | Admitting: Family Medicine

## 2022-04-10 ENCOUNTER — Telehealth: Payer: Self-pay | Admitting: Family Medicine

## 2022-04-10 DIAGNOSIS — F988 Other specified behavioral and emotional disorders with onset usually occurring in childhood and adolescence: Secondary | ICD-10-CM

## 2022-04-10 MED ORDER — AMPHETAMINE-DEXTROAMPHET ER 25 MG PO CP24: 25.0000 mg | ORAL_CAPSULE | ORAL | 0 refills | Status: DC

## 2022-04-10 MED ORDER — AMPHETAMINE-DEXTROAMPHETAMINE 10 MG PO TABS: 10.0000 mg | ORAL_TABLET | Freq: Every day | ORAL | 0 refills | Status: DC

## 2022-04-10 NOTE — Telephone Encounter (Signed)
Prescription filled at pt's request ?

## 2022-04-10 NOTE — Telephone Encounter (Signed)
Encourage patient to contact the pharmacy for refills or they can request refills through [facility]  (Please schedule appointment if patient has not been seen in over a year)    WHAT [place] [facility]ENT TO: [id number] MEDICATION NAME & DOSE:Adderall 10 and '25mg'$   NOTES/COMMENTS FROM PATIENT: Pt called stating that she is out of both medication. Pt states that she want a 90 day supply on the Addreall 10 mg.       [place] please notify patient: It takes 48-72 hours to process rx refill requests Ask patient to call pharmacy to ensure rx is ready before heading there.

## 2022-05-06 ENCOUNTER — Other Ambulatory Visit: Payer: Self-pay | Admitting: Family Medicine

## 2022-05-07 NOTE — Telephone Encounter (Signed)
Patient is requesting a refill of the following medications: Requested Prescriptions   Pending Prescriptions Disp Refills   PRISTIQ 50 MG 24 hr tablet [Pharmacy Med Name: PRISTIQ ER 50 MG TABLET] 90 tablet 1    Sig: TAKE 1 TABLET BY MOUTH EVERY DAY    Date of patient request: 05/07/22 Last office visit: 02/19/22 Date of last refill: 10/31/21 Last refill amount: 90

## 2022-05-12 ENCOUNTER — Other Ambulatory Visit: Payer: Self-pay | Admitting: Family Medicine

## 2022-05-12 DIAGNOSIS — F988 Other specified behavioral and emotional disorders with onset usually occurring in childhood and adolescence: Secondary | ICD-10-CM

## 2022-05-12 NOTE — Telephone Encounter (Signed)
Patient is requesting a refill of the following medications: Requested Prescriptions   Pending Prescriptions Disp Refills   amphetamine-dextroamphetamine (ADDERALL XR) 25 MG 24 hr capsule 30 capsule 0    Sig: Take 1 capsule by mouth every morning.   amphetamine-dextroamphetamine (ADDERALL) 10 MG tablet 30 tablet 0    Sig: Take 1 tablet (10 mg total) by mouth daily.    Date of patient request: 05/12/22 Last office visit: 02/19/22 Date of last refill: 04/10/22 Last refill amount: 30   Also asking about pristique? Could call and ask about a different dose temporarily?

## 2022-05-12 NOTE — Telephone Encounter (Signed)
Pt also states that she can't find a pharmacy that have Pristiq 50 mg in stock. Pt want to know what should she do about this. Pt also states that she haven't had her medication in three days and she is starting to have withdrawals . Examples dizziness, stomach sickness and  different emotions. Pt is isn't feeling like herself.

## 2022-05-12 NOTE — Telephone Encounter (Signed)
Encourage patient to contact the pharmacy for refills or they can request refills through [facility]  (Please schedule appointment if patient has not been seen in over a year)    WHAT PHARMACY WOULD THEY LIKE THIS SENT TO: [id number] [place] [address] [place]SE: Adderall XR 25 mg  NOTES/COMMENTS FROM PATIENT: pt only have two capsule left.      [place] please notify patient: It takes 48-72 hours to process rx refill requests Ask patient to call pharmacy to ensure rx is ready before heading there.

## 2022-05-13 MED ORDER — AMPHETAMINE-DEXTROAMPHETAMINE 10 MG PO TABS: 10.0000 mg | ORAL_TABLET | Freq: Every day | ORAL | 0 refills | Status: DC

## 2022-05-13 MED ORDER — AMPHETAMINE-DEXTROAMPHET ER 25 MG PO CP24: 25.0000 mg | ORAL_CAPSULE | ORAL | 0 refills | Status: DC

## 2022-05-13 MED ORDER — DESVENLAFAXINE SUCCINATE ER 25 MG PO TB24: 50.0000 mg | ORAL_TABLET | Freq: Every day | ORAL | 3 refills | Status: DC

## 2022-05-13 NOTE — Telephone Encounter (Signed)
Prescriptions for Adderall were sent to pharmacy  Also, changed Pristiq to '25mg'$ - 2 tabs daily- to equal the '50mg'$  dose she can't find

## 2022-06-12 ENCOUNTER — Other Ambulatory Visit: Payer: Self-pay | Admitting: Family Medicine

## 2022-06-18 ENCOUNTER — Other Ambulatory Visit: Payer: Self-pay | Admitting: Family Medicine

## 2022-06-18 DIAGNOSIS — F988 Other specified behavioral and emotional disorders with onset usually occurring in childhood and adolescence: Secondary | ICD-10-CM

## 2022-06-18 MED ORDER — AMPHETAMINE-DEXTROAMPHETAMINE 10 MG PO TABS: 10.0000 mg | ORAL_TABLET | Freq: Every day | ORAL | 0 refills | Status: DC

## 2022-06-18 MED ORDER — AMPHETAMINE-DEXTROAMPHET ER 25 MG PO CP24: 25.0000 mg | ORAL_CAPSULE | ORAL | 0 refills | Status: DC

## 2022-06-18 NOTE — Telephone Encounter (Signed)
Encourage patient to contact the pharmacy for refills or they can request refills through [facility]  (Please schedule appointment if patient has not been seen in over a year)    WHAT [place] TO: [place] [phone]  MEDICATION NAME & DOSE: addereall xr 25 mg  NOTES/COMMENTS FROM PATIENT:      [place] office please notify patient: It takes 48-72 hours to process rx refill requests Ask patient to call pharmacy to ensure rx is ready before heading there.

## 2022-06-18 NOTE — Telephone Encounter (Signed)
Called and informed pt also informed her she is overdue for her BP follow up and notes she had forgotten about this will call back Friday after she gets her work schedule and will make that appointment

## 2022-06-18 NOTE — Telephone Encounter (Signed)
Patient is requesting a refill of the following medications: Requested Prescriptions   Pending Prescriptions Disp Refills   amphetamine-dextroamphetamine (ADDERALL XR) 25 MG 24 hr capsule 30 capsule 0    Sig: Take 1 capsule by mouth every morning.   amphetamine-dextroamphetamine (ADDERALL) 10 MG tablet 30 tablet 0    Sig: Take 1 tablet (10 mg total) by mouth daily.    Date of patient request: 06/18/22 Last office visit: 02/19/22 Date of last refill: 05/13/22 Last refill amount: 30 Follow up time period per chart: 6/8 weeks for BP

## 2022-07-25 ENCOUNTER — Other Ambulatory Visit: Payer: Self-pay | Admitting: Family Medicine

## 2022-07-25 MED ORDER — AMPHETAMINE-DEXTROAMPHETAMINE 10 MG PO TABS: ORAL_TABLET | ORAL | 0 refills | Status: DC

## 2022-07-25 MED ORDER — AMPHETAMINE-DEXTROAMPHET ER 25 MG PO CP24: 25.0000 mg | ORAL_CAPSULE | ORAL | 0 refills | Status: DC

## 2022-07-25 NOTE — Telephone Encounter (Signed)
Pt requesting Adderall 10 mg and 25 mg LOV: 02/19/22 Last Refill:06/18/22 Upcoming appt: 06/18/22

## 2022-07-25 NOTE — Telephone Encounter (Signed)
Encourage patient to contact the pharmacy for refills or they can request refills through [facility]  (Please schedule appointment if patient has not been seen in over a year)  Last visit was 02/19/22  WHAT PHARMACY WOULD THEY LIKE THIS SENT TO:  [id number] [place] [place] [address] [place] DOSE: Adderall 10 mg and 25 mg  NOTES/COMMENTS FROM PATIENT: pt states that her Adderall 10 mg suppose to be a 90 day supply and 25 mg is 30 day supply for insurance to cover medications.       [place] please notify patient: It takes 48-72 hours to process rx refill requests Ask patient to call pharmacy to ensure rx is ready before heading there.

## 2022-08-29 ENCOUNTER — Other Ambulatory Visit: Payer: Self-pay

## 2022-08-29 MED ORDER — AMPHETAMINE-DEXTROAMPHET ER 25 MG PO CP24: 25.0000 mg | ORAL_CAPSULE | ORAL | 0 refills | Status: DC

## 2022-08-29 NOTE — Progress Notes (Signed)
L/M and informed pt the medication has been sent to the pharmacy

## 2022-08-29 NOTE — Progress Notes (Signed)
LOV: 02/19/2022 Last Refill:07/25/2022 #30 0 refills Upcoming appt: nothing scheduled yet

## 2022-08-29 NOTE — Progress Notes (Signed)
Medication sent to pharmacy as requested.

## 2022-09-30 ENCOUNTER — Other Ambulatory Visit: Payer: Self-pay | Admitting: Family Medicine

## 2022-09-30 DIAGNOSIS — F988 Other specified behavioral and emotional disorders with onset usually occurring in childhood and adolescence: Secondary | ICD-10-CM

## 2022-09-30 NOTE — Telephone Encounter (Signed)
Adderall XR 25 mg LOV: 02/19/22 Last Refill:08/29/22 Upcoming appt: no apt  [facility] [place] [facility]

## 2022-09-30 NOTE — Telephone Encounter (Signed)
Encourage patient to contact the pharmacy for refills or they can request refills through [place] appt Feb 19, 2022    WHAT [place] [facility]: [id number] [place]   MEDICATION NAME & DOSE:amphetamine-dextroamphetamine (ADDERALL XR) 25 MG 24 hr capsule.   NOTES/COMMENTS FROM PATIENT: Pt is scheduled to see Dr. TBirdie Riddle2/04/2023 for her 6-8 weeks follow-up.      [place] please notify patient: It takes 48-72 hours to process rx refill requests Ask patient to call pharmacy to ensure rx is ready before heading there.

## 2022-10-01 MED ORDER — AMPHETAMINE-DEXTROAMPHET ER 25 MG PO CP24: 25.0000 mg | ORAL_CAPSULE | ORAL | 0 refills | Status: DC

## 2022-10-01 NOTE — Telephone Encounter (Signed)
Left pt a VM stating Rx has been sent to pharmacy

## 2022-10-21 ENCOUNTER — Other Ambulatory Visit: Payer: Self-pay | Admitting: Family Medicine

## 2022-10-30 ENCOUNTER — Ambulatory Visit: Payer: No Typology Code available for payment source | Admitting: Family Medicine

## 2022-11-05 ENCOUNTER — Other Ambulatory Visit: Payer: Self-pay | Admitting: Family Medicine

## 2022-11-05 ENCOUNTER — Other Ambulatory Visit: Payer: Self-pay

## 2022-11-05 MED ORDER — PRISTIQ 50 MG PO TB24: 50.0000 mg | ORAL_TABLET | Freq: Every day | ORAL | 1 refills | Status: DC

## 2022-11-05 MED ORDER — AMPHETAMINE-DEXTROAMPHET ER 25 MG PO CP24: 25.0000 mg | ORAL_CAPSULE | ORAL | 0 refills | Status: DC

## 2022-11-05 NOTE — Telephone Encounter (Signed)
Pt aware Rx has been sent in

## 2022-11-05 NOTE — Telephone Encounter (Signed)
Adderall XR  25 mg LOV: 02/19/22 Last Refill:10/01/22 Upcoming appt: 11/13/22

## 2022-11-05 NOTE — Telephone Encounter (Signed)
Encourage patient to contact the pharmacy for refills or they can request refills through [facility]  (Please schedule appointment if patient has not been seen in over a year)    WHAT PHARMACY WOULD THEY LIKE THIS SENT TO: [facility] [place] [place]: ADDERALL XR 25 MG and PRISTIQ 50 MG (this one has to be non generic)  NOTES/COMMENTS FROM PATIENT:  Pt would like to make sure that [place] to be name brand      Front office please notify patient: It takes 48-72 hours to process rx refill requests Ask patient to call pharmacy to ensure rx is ready before heading there.

## 2022-11-10 ENCOUNTER — Other Ambulatory Visit: Payer: Self-pay | Admitting: Family Medicine

## 2022-11-13 ENCOUNTER — Encounter: Payer: Self-pay | Admitting: Family Medicine

## 2022-11-13 ENCOUNTER — Ambulatory Visit (INDEPENDENT_AMBULATORY_CARE_PROVIDER_SITE_OTHER): Payer: Self-pay | Admitting: Family Medicine

## 2022-11-13 VITALS — BP 124/86 | HR 107 | Temp 98.0°F | Resp 16 | Ht 69.0 in | Wt 246.0 lb

## 2022-11-13 DIAGNOSIS — F32A Depression, unspecified: Secondary | ICD-10-CM

## 2022-11-13 DIAGNOSIS — E669 Obesity, unspecified: Secondary | ICD-10-CM

## 2022-11-13 DIAGNOSIS — R030 Elevated blood-pressure reading, without diagnosis of hypertension: Secondary | ICD-10-CM | POA: Insufficient documentation

## 2022-11-13 DIAGNOSIS — K219 Gastro-esophageal reflux disease without esophagitis: Secondary | ICD-10-CM

## 2022-11-13 DIAGNOSIS — F988 Other specified behavioral and emotional disorders with onset usually occurring in childhood and adolescence: Secondary | ICD-10-CM

## 2022-11-13 MED ORDER — PANTOPRAZOLE SODIUM 40 MG PO TBEC: 40.0000 mg | DELAYED_RELEASE_TABLET | Freq: Every day | ORAL | 1 refills | Status: AC

## 2022-11-13 NOTE — Patient Instructions (Addendum)
Schedule your complete physical in 6 months We'll notify you of your lab results and make any changes if needed Continue to work on healthy diet and regular exercise- you're doing great!!! Call and schedule your GI appt for reflux START the Pantoprazole daily for reflux You can add Pepcid as needed for breakthrough symtoms Call with any questions or concerns Stay Safe!  Stay Healthy!

## 2022-11-13 NOTE — Progress Notes (Signed)
   Subjective:    Patient ID: Kristi Hill, female    DOB: [birth year], 52 y.o.   MRN: [id number]  HPI Elevated BP- at last visit BP was 144/96 (May 2023).  She was supposed to return in 6-8 weeks to recheck BP but did not come back until today.  Today BP is 124/86.  Denies CP, SOB, HA's, visual changes, edema.  Obesity- pt is down 17 lbs since last visit.  BMI now 36.33  Pt reports no changes to diet or exercise.  GERD- pt reports taking daily OTC Prilosec and Pepcid.  Asking for prescription relief  Depression- ongoing issue for pt.  Currently on Pristiq 77m daily w/ good control.  Only difficulty is obtaining medication due to supply issues.  ADHD- ongoing issue for pt.  Currently on Adderall XR 265mdaily and Adderall 1060ms needed for additional   Review of Systems For ROS see HPI     Objective:   Physical Exam Vitals reviewed.  Constitutional:      General: She is not in acute distress.    Appearance: Normal appearance. She is well-developed. She is obese. She is not ill-appearing.  HENT:     Head: Normocephalic and atraumatic.  Eyes:     Conjunctiva/sclera: Conjunctivae normal.     Pupils: Pupils are equal, round, and reactive to light.  Neck:     Thyroid: No thyromegaly.  Cardiovascular:     Rate and Rhythm: Normal rate and regular rhythm.     Pulses: Normal pulses.     Heart sounds: Normal heart sounds. No murmur heard. Pulmonary:     Effort: Pulmonary effort is normal. No respiratory distress.     Breath sounds: Normal breath sounds.  Abdominal:     General: There is no distension.     Palpations: Abdomen is soft.     Tenderness: There is no abdominal tenderness.  Musculoskeletal:     Cervical back: Normal range of motion and neck supple.     Right lower leg: No edema.     Left lower leg: No edema.  Lymphadenopathy:     Cervical: No cervical adenopathy.  Skin:    General: Skin is warm and dry.  Neurological:     Mental Status: She is alert and  oriented to person, place, and time.  Psychiatric:        Behavior: Behavior normal.           Assessment & Plan:

## 2022-11-14 ENCOUNTER — Telehealth: Payer: Self-pay

## 2022-11-14 LAB — HEPATIC FUNCTION PANEL
ALT: 12 U/L (ref 0–35)
AST: 23 U/L (ref 0–37)
Albumin: 4.5 g/dL (ref 3.5–5.2)
Alkaline Phosphatase: 53 U/L (ref 39–117)
Bilirubin, Direct: 0.1 mg/dL (ref 0.0–0.3)
Total Bilirubin: 0.5 mg/dL (ref 0.2–1.2)
Total Protein: 7.1 g/dL (ref 6.0–8.3)

## 2022-11-14 LAB — CBC WITH DIFFERENTIAL/PLATELET
Basophils Absolute: 0 10*3/uL (ref 0.0–0.1)
Basophils Relative: 0.8 % (ref 0.0–3.0)
Eosinophils Absolute: 0.1 10*3/uL (ref 0.0–0.7)
Eosinophils Relative: 1.7 % (ref 0.0–5.0)
HCT: 42.3 % (ref 36.0–46.0)
Hemoglobin: 14.5 g/dL (ref 12.0–15.0)
Lymphocytes Relative: 32.7 % (ref 12.0–46.0)
Lymphs Abs: 2.1 10*3/uL (ref 0.7–4.0)
MCHC: 34.4 g/dL (ref 30.0–36.0)
MCV: 93.6 fl (ref 78.0–100.0)
Monocytes Absolute: 0.8 10*3/uL (ref 0.1–1.0)
Monocytes Relative: 12.3 % — ABNORMAL HIGH (ref 3.0–12.0)
Neutro Abs: 3.3 10*3/uL (ref 1.4–7.7)
Neutrophils Relative %: 52.5 % (ref 43.0–77.0)
Platelets: 319 10*3/uL (ref 150.0–400.0)
RBC: 4.52 Mil/uL (ref 3.87–5.11)
RDW: 13 % (ref 11.5–15.5)
WBC: 6.3 10*3/uL (ref 4.0–10.5)

## 2022-11-14 LAB — BASIC METABOLIC PANEL
BUN: 11 mg/dL (ref 6–23)
CO2: 28 mEq/L (ref 19–32)
Calcium: 10.1 mg/dL (ref 8.4–10.5)
Chloride: 100 mEq/L (ref 96–112)
Creatinine, Ser: 1.09 mg/dL (ref 0.40–1.20)
GFR: 59.53 mL/min — ABNORMAL LOW (ref >60.00)
Glucose, Bld: 86 mg/dL (ref 70–99)
Potassium: 4.3 mEq/L (ref 3.5–5.1)
Sodium: 138 mEq/L (ref 135–145)

## 2022-11-14 LAB — LDL CHOLESTEROL, DIRECT: Direct LDL: 49 mg/dL

## 2022-11-14 LAB — VITAMIN D 25 HYDROXY (VIT D DEFICIENCY, FRACTURES): VITD: 81.29 ng/mL (ref 30.00–100.00)

## 2022-11-14 LAB — TSH: TSH: 0.72 u[IU]/mL (ref 0.35–5.50)

## 2022-11-14 LAB — LIPID PANEL
Cholesterol: 171 mg/dL (ref 0–200)
HDL: 57.4 mg/dL (ref >39.00)
NonHDL: 113.69
Total CHOL/HDL Ratio: 3
Triglycerides: 233 mg/dL — ABNORMAL HIGH (ref 0.0–149.0)
VLDL: 46.6 mg/dL — ABNORMAL HIGH (ref 0.0–40.0)

## 2022-11-14 LAB — HEMOGLOBIN A1C: Hgb A1c MFr Bld: 5.2 % (ref 4.6–6.5)

## 2022-11-14 NOTE — Telephone Encounter (Signed)
Pt seen results Via my chart  

## 2022-11-14 NOTE — Telephone Encounter (Signed)
-----   Message from Midge Minium, MD sent at 11/14/2022 12:29 PM EST ----- Labs look great!  No changes at this time

## 2022-11-18 DIAGNOSIS — K219 Gastro-esophageal reflux disease without esophagitis: Secondary | ICD-10-CM | POA: Insufficient documentation

## 2022-11-18 NOTE — Assessment & Plan Note (Signed)
Chronic problem.  Currently well controlled on Adderall XR '25mg'$  daily w/ an additional '10mg'$  in the afternoon as needed.  No changes at this time.  Will follow.

## 2022-11-18 NOTE — Assessment & Plan Note (Signed)
Ongoing issue.  Pt is down 17 lbs since last visit and BMI now 36.33  She states no changes to diet or exercise- which is somewhat concerning for diabetes.  Encouraged low carb diet and regular exercise.  Will continue to follow.

## 2022-11-18 NOTE — Assessment & Plan Note (Signed)
Pt reports taking daily Pepcid and Prilosec and still having breakthrough sxs.  Will switch to prescription strength PPI.  Pt expressed understanding and is in agreement w/ plan.

## 2022-11-18 NOTE — Assessment & Plan Note (Signed)
Ongoing issue.  Currently adequate control on Pristiq.  No med changes at this time unless supply issues become a bigger deal

## 2022-11-18 NOTE — Assessment & Plan Note (Signed)
BP is much better today.  Reports home BP's are well controlled.  Currently asymptomatic.  No need for medication at this time.  Will follow.

## 2022-12-10 ENCOUNTER — Telehealth: Payer: Self-pay | Admitting: Family Medicine

## 2022-12-10 ENCOUNTER — Other Ambulatory Visit: Payer: Self-pay | Admitting: Family Medicine

## 2022-12-10 NOTE — Telephone Encounter (Signed)
Encourage patient to contact the pharmacy for refills or they can request refills through [place] TO:  [id number] [place] [place]: amphetamine-dextroamphetamine (ADDERALL XR) 25 MG 24 hr capsule   NOTES/COMMENTS FROM PATIENT:      [place] office please notify patient: It takes 48-72 hours to process rx refill requests Ask patient to call pharmacy to ensure rx is ready before heading there.

## 2022-12-10 NOTE — Telephone Encounter (Signed)
Adderall XR 25 mg LOV: 11/13/22 Last Refill:11/05/22 Upcoming appt: none

## 2022-12-11 ENCOUNTER — Encounter: Payer: Self-pay | Admitting: Family Medicine

## 2022-12-11 MED ORDER — AMPHETAMINE-DEXTROAMPHET ER 25 MG PO CP24: 25.0000 mg | ORAL_CAPSULE | ORAL | 0 refills | Status: DC

## 2022-12-11 NOTE — Telephone Encounter (Signed)
Left vm stating Rx has been sent in  

## 2022-12-17 NOTE — Telephone Encounter (Signed)
error 

## 2023-01-09 ENCOUNTER — Other Ambulatory Visit: Payer: Self-pay | Admitting: Family Medicine

## 2023-01-09 MED ORDER — AMPHETAMINE-DEXTROAMPHET ER 25 MG PO CP24: 25.0000 mg | ORAL_CAPSULE | ORAL | 0 refills | Status: DC

## 2023-01-09 NOTE — Telephone Encounter (Signed)
Patient is requesting a refill of the following medications: Requested Prescriptions   Pending Prescriptions Disp Refills   amphetamine-dextroamphetamine (ADDERALL XR) 25 MG 24 hr capsule 30 capsule 0    Sig: Take 1 capsule by mouth every morning.    Date of patient request: 01/09/2023 Last office visit: 10/01/2022 Date of last refill: 11/13/2022 Last refill amount: 30

## 2023-01-09 NOTE — Telephone Encounter (Signed)
Encourage patient to contact the pharmacy for refills or they can request refills through [facility]  WHAT PHARMACY WOULD THEY LIKE THIS SENT TO:  [id number] [place] [place] - [address]   MEDICATION NAME & DOSE: amphetamine-dextroamphetamine (ADDERALL XR) 25 MG 24 hr capsule   NOTES/COMMENTS FROM PATIENT:      Front office please notify patient: It takes 48-72 hours to process rx refill requests Ask patient to call pharmacy to ensure rx is ready before heading there.   

## 2023-02-10 ENCOUNTER — Other Ambulatory Visit: Payer: Self-pay

## 2023-02-10 MED ORDER — AMPHETAMINE-DEXTROAMPHET ER 25 MG PO CP24: 25.0000 mg | ORAL_CAPSULE | ORAL | 0 refills | Status: DC

## 2023-02-10 NOTE — Telephone Encounter (Signed)
Patient is requesting a refill of the following medications: Requested Prescriptions   Pending Prescriptions Disp Refills   amphetamine-dextroamphetamine (ADDERALL XR) 25 MG 24 hr capsule 30 capsule 0    Sig: Take 1 capsule by mouth every morning.    Date of patient request: 02/10/23 Last office visit: 11/13/22 Date of last refill: 01/09/23 Last refill amount: 30 Follow up time period per chart: 6 months

## 2023-03-17 ENCOUNTER — Other Ambulatory Visit: Payer: Self-pay | Admitting: Family Medicine

## 2023-03-17 NOTE — Telephone Encounter (Signed)
Med refill  Medication: amphetamine-dextroamphetamine (ADDERALL XR) 25 MG 24 hr capsule   Pharmacy:  [id number] [place] - [address]   Last OV: 2.22.24  Next OV: n/a

## 2023-03-17 NOTE — Telephone Encounter (Signed)
Adderall XR 25 mg LOV: 11/13/22 Last Refill:02/10/23 Upcoming appt: none

## 2023-03-18 MED ORDER — AMPHETAMINE-DEXTROAMPHET ER 25 MG PO CP24: 25.0000 mg | ORAL_CAPSULE | ORAL | 0 refills | Status: DC

## 2023-03-18 NOTE — Telephone Encounter (Signed)
Pt aware of the refill

## 2023-04-22 ENCOUNTER — Other Ambulatory Visit: Payer: Self-pay | Admitting: Family Medicine

## 2023-04-22 MED ORDER — AMPHETAMINE-DEXTROAMPHET ER 25 MG PO CP24: 25.0000 mg | ORAL_CAPSULE | ORAL | 0 refills | Status: DC

## 2023-04-22 NOTE — Telephone Encounter (Signed)
Adderall XR 25 mg LOV: 11/13/22 [place]:26/224 Upcoming appt: none   Pt states she gets Adderall Xr 10 mg but I do not see that in med list

## 2023-04-22 NOTE — Telephone Encounter (Signed)
Encourage patient to contact the pharmacy for refills or they can request refills through [facility]  WHAT PHARMACY WOULD THEY LIKE THIS SENT TO:  [id number] [place], [place] - [address]   MEDICATION NAME & DOSE: amphetamine-dextroamphetamine (ADDERALL XR) 25 MG 24 hr capsule  NOTES/COMMENTS FROM PATIENT:  Pt states she also get a 10mg  Adderall XR 24HR cap with this prescription- advise      Front office please notify patient: It takes 48-72 hours to process rx refill requests Ask patient to call pharmacy to ensure rx is ready before heading there.

## 2023-05-28 ENCOUNTER — Other Ambulatory Visit: Payer: Self-pay | Admitting: Family Medicine

## 2023-05-28 MED ORDER — AMPHETAMINE-DEXTROAMPHET ER 25 MG PO CP24: 25.0000 mg | ORAL_CAPSULE | ORAL | 0 refills | Status: DC

## 2023-05-28 NOTE — Telephone Encounter (Signed)
Encourage patient to contact the pharmacy for refills or they can request refills through [facility]  (Please schedule appointment if patient has not been seen in over a year)    WHAT PHARMACY WOULD THEY LIKE THIS SENT TO: [id number] [place] - [address]   MEDICATION NAME & DOSE: amphetamine-dextroamphetamine (ADDERALL XR) 25 MG 24 hr capsule   NOTES/COMMENTS FROM PATIENT: Last OV 11/13/22     Front office please notify patient: It takes 48-72 hours to process rx refill requests Ask patient to call pharmacy to ensure rx is ready before heading there.

## 2023-05-28 NOTE — Telephone Encounter (Signed)
Adderall XR 25 mg Requested Prescriptions    No prescriptions requested or ordered in this encounter     Date of patient request: 05/28/23 Last office visit: 11/13/22 Date of last refill: 04/22/23 Last refill amount: 30 Follow up time period per chart: 6 months

## 2023-06-26 ENCOUNTER — Other Ambulatory Visit: Payer: Self-pay | Admitting: Family Medicine

## 2023-06-26 DIAGNOSIS — Z1211 Encounter for screening for malignant neoplasm of colon: Secondary | ICD-10-CM

## 2023-06-26 DIAGNOSIS — Z1212 Encounter for screening for malignant neoplasm of rectum: Secondary | ICD-10-CM

## 2023-07-01 ENCOUNTER — Other Ambulatory Visit: Payer: Self-pay | Admitting: Family Medicine

## 2023-07-01 MED ORDER — AMPHETAMINE-DEXTROAMPHET ER 25 MG PO CP24: 25.0000 mg | ORAL_CAPSULE | ORAL | 0 refills | Status: DC

## 2023-07-01 NOTE — Telephone Encounter (Signed)
LM informing her her "refill has been sent in"

## 2023-07-01 NOTE — Telephone Encounter (Signed)
Encourage patient to contact the pharmacy for refills or they can request refills through [facility]  (Please schedule appointment if patient has not been seen in over a year)    WHAT PHARMACY WOULD THEY LIKE THIS SENT TO: [id number] [place], [place] - [address]   MEDICATION NAME & DOSE: amphetamine-dextroamphetamine (ADDERALL XR) 25 MG 24 hr capsule   NOTES/COMMENTS FROM PATIENT:      Front office please notify patient: It takes 48-72 hours to process rx refill requests Ask patient to call pharmacy to ensure rx is ready before heading there.

## 2023-07-01 NOTE — Telephone Encounter (Signed)
Patient is requesting a refill of the following medications: Requested Prescriptions   Pending Prescriptions Disp Refills   amphetamine-dextroamphetamine (ADDERALL XR) 25 MG 24 hr capsule 30 capsule 0    Sig: Take 1 capsule by mouth every morning.    Date of patient request: 07/01/23 Last office visit: 11/13/22 Date of last refill: 05/28/23 Last refill amount: 30 Follow up time period per chart: 6 months

## 2023-07-29 ENCOUNTER — Other Ambulatory Visit: Payer: Self-pay | Admitting: Family Medicine

## 2023-07-29 MED ORDER — AMPHETAMINE-DEXTROAMPHET ER 25 MG PO CP24: 25.0000 mg | ORAL_CAPSULE | ORAL | 0 refills | Status: DC

## 2023-07-29 NOTE — Telephone Encounter (Signed)
Encourage patient to contact the pharmacy for refills or they can request refills through [facility]  (Please schedule appointment if patient has not been seen in over a year)    WHAT PHARMACY WOULD THEY LIKE THIS SENT TO:   MEDICATION NAME & DOSE:  amphetamine-dextroamphetamine (ADDERALL XR) 25 MG 24 hr capsule  Pt would like to have the 10mg  sent in as well if possible  NOTES/COMMENTS FROM PATIENT: Husband had mixup with insurance and pt currently doesn't have insurance coverage and won't have it until January. I advised her she may need an appt before the 10mg  can be renewed but she won't be able to come back in until insurance has gone into effect. She said if approved she is completely fine, and actually would prefer a 30-day supply      Front office please notify patient: It takes 48-72 hours to process rx refill requests Ask patient to call pharmacy to ensure rx is ready before heading there.

## 2023-07-29 NOTE — Telephone Encounter (Signed)
Patient is requesting a refill of the following medications: Requested Prescriptions   Pending Prescriptions Disp Refills   amphetamine-dextroamphetamine (ADDERALL XR) 25 MG 24 hr capsule 30 capsule 0    Sig: Take 1 capsule by mouth every morning.    Date of patient request: 07/29/23 Last office visit: 11/13/22 Date of last refill: 06/30/23 Last refill amount: 30

## 2023-08-28 ENCOUNTER — Telehealth: Payer: Self-pay | Admitting: Family Medicine

## 2023-08-28 MED ORDER — AMPHETAMINE-DEXTROAMPHET ER 25 MG PO CP24: 25.0000 mg | ORAL_CAPSULE | ORAL | 0 refills | Status: DC

## 2023-08-28 NOTE — Telephone Encounter (Signed)
Prescription sent to pharmacy.

## 2023-08-28 NOTE — Telephone Encounter (Signed)
Encourage patient to contact the pharmacy for refills or they can request refills through [facility]  WHAT PHARMACY WOULD THEY LIKE THIS SENT TO:  [id number] [place], [place] - [address]    MEDICATION NAME & DOSE: amphetamine-dextroamphetamine amphetamine-dextroamphetamine (ADDERALL XR) 25 MG 24 hr capsule  NOTES/COMMENTS FROM PATIENT:      Front office please notify patient: It takes 48-72 hours to process rx refill requests Ask patient to call pharmacy to ensure rx is ready before heading there.

## 2023-08-28 NOTE — Telephone Encounter (Signed)
  Amphetamine 25 24 hr   Date of patient request: 08/28/2023 Last office visit: 11/13/2022 Upcoming visit: Visit date not found Date of last refill: 07/29/2023 Last refill amount: 30

## 2023-09-29 ENCOUNTER — Telehealth: Payer: Self-pay

## 2023-09-29 MED ORDER — AMPHETAMINE-DEXTROAMPHET ER 25 MG PO CP24: 25.0000 mg | ORAL_CAPSULE | ORAL | 0 refills | Status: DC

## 2023-09-29 NOTE — Telephone Encounter (Signed)
 Left vm to notify this rx has been sent.

## 2023-09-29 NOTE — Telephone Encounter (Signed)
 Prescription sent

## 2023-09-29 NOTE — Telephone Encounter (Signed)
 Copied from CRM [id number]. Topic: Clinical - Medication Refill >> Sep 28, 2023  3:12 PM Suzette B wrote: Most Recent Primary Care Visit:  Provider: TABORI, KATHERINE E  Department: [facility]  Visit Type: OFFICE VISIT  Date: 11/13/2022  Medication: amphetamine -dextroamphetamine  (ADDERALL XR) 25 MG 24 hr capsule  Has the patient contacted their pharmacy? Yes (Agent: If no, request that the patient contact the pharmacy for the refill. If patient does not wish to contact the pharmacy document the reason why and proceed with request.) (Agent: If yes, when and what did the pharmacy advise?) advised patient to call provider to request refill  Is this the correct pharmacy for this prescription? Yes If no, delete pharmacy and type the correct one.  This is the patient's preferred pharmacy:  [id number] [place], [place] - [address] [place] [address] Phone: [phone] Fax: [phone]   Has the prescription been filled recently? Yes  Is the patient out of the medication? No she has 1 or 2 left  Has the patient been seen for an appointment in the last year OR does the patient have an upcoming appointment? Yes  Can we respond through MyChart? No  Agent: Please be advised that Rx refills may take up to 3 business days. We ask that you follow-up with your pharmacy.

## 2023-09-29 NOTE — Addendum Note (Signed)
 Addended by: Sheliah Hatch on: 09/29/2023 10:32 AM   Modules accepted: Orders

## 2023-10-28 ENCOUNTER — Other Ambulatory Visit: Payer: Self-pay | Admitting: Family Medicine

## 2023-10-28 ENCOUNTER — Other Ambulatory Visit: Payer: Self-pay

## 2023-10-28 MED ORDER — AMPHETAMINE-DEXTROAMPHET ER 25 MG PO CP24: 25.0000 mg | ORAL_CAPSULE | ORAL | 0 refills | Status: DC

## 2023-10-28 NOTE — Addendum Note (Signed)
 Addended by: Tecumseh Yeagley E on: 10/28/2023 03:19 PM   Modules accepted: Orders

## 2023-10-28 NOTE — Telephone Encounter (Signed)
 Copied from CRM [id number]. Topic: Clinical - Medication Refill >> Oct 28, 2023  2:17 PM Corean SAUNDERS wrote: Most Recent Primary Care Visit:  Provider: TABORI, KATHERINE E  Department: [facility]  Visit Type: OFFICE VISIT  Date: 11/13/2022  Medication: amphetamine -dextroamphetamine  (ADDERALL XR) 25 MG 24 hr capsule  Has the patient contacted their pharmacy? No, out of re-fills (Agent: If no, request that the patient contact the pharmacy for the refill. If patient does not wish to contact the pharmacy document the reason why and proceed with request.) (Agent: If yes, when and what did the pharmacy advise?)  Is this the correct pharmacy for this prescription? Yes If no, delete pharmacy and type the correct one.  This is the patient's preferred pharmacy:  [id number] [place], [place] - [address] [place] [address] Phone: [phone] Fax: [phone]   Has the prescription been filled recently? Yes  Is the patient out of the medication? No but only has 1 dose left.  Has the patient been seen for an appointment in the last year OR does the patient have an upcoming appointment? Yes  Can we respond through MyChart? No  Agent: Please be advised that Rx refills may take up to 3 business days. We ask that you follow-up with your pharmacy.

## 2023-11-27 ENCOUNTER — Other Ambulatory Visit: Payer: Self-pay | Admitting: Family Medicine

## 2023-11-27 NOTE — Telephone Encounter (Signed)
 Copied from CRM [id number]. Topic: Clinical - Medication Refill >> Nov 27, 2023 10:35 AM Theodis Sato wrote: Most Recent Primary Care Visit:  Provider: Sheliah Hatch  Department: [facility]  Visit Type: OFFICE VISIT  Date: 11/13/2022  Medication: amphetamine-dextroamphetamine (ADDERALL XR) 25 MG 24 hr capsule  Has the patient contacted their pharmacy? No, controlled substance (Agent: If no, request that the patient contact the pharmacy for the refill. If patient does not wish to contact the pharmacy document the reason why and proceed with request.) (Agent: If yes, when and what did the pharmacy advise?)  Is this the correct pharmacy for this prescription? Yes If no, delete pharmacy and type the correct one.  This is the patient's preferred pharmacy:  [id number] [place], [place] - [address] [place] [address] Phone: [phone] Fax: [phone]   Has the prescription been filled recently? Yes  Is the patient out of the medication? 1 left  Has the patient been seen for an appointment in the last year OR does the patient have an upcoming appointment? No  Can we respond through MyChart? Yes  Agent: Please be advised that Rx refills may take up to 3 business days. We ask that you follow-up with your pharmacy.

## 2023-12-01 ENCOUNTER — Other Ambulatory Visit: Payer: Self-pay

## 2023-12-01 ENCOUNTER — Telehealth: Payer: Self-pay

## 2023-12-01 MED ORDER — AMPHETAMINE-DEXTROAMPHET ER 25 MG PO CP24: 25.0000 mg | ORAL_CAPSULE | ORAL | 0 refills | Status: DC

## 2023-12-01 MED ORDER — PRISTIQ 50 MG PO TB24: 50.0000 mg | ORAL_TABLET | Freq: Every day | ORAL | 3 refills | Status: DC

## 2023-12-01 MED ORDER — PRISTIQ 50 MG PO TB24: 50.0000 mg | ORAL_TABLET | Freq: Every day | ORAL | 1 refills | Status: DC

## 2023-12-01 NOTE — Telephone Encounter (Signed)
 Copied from CRM [id number]. Topic: Clinical - Prescription Issue >> Dec 01, 2023 11:03 AM Elizebeth Brooking wrote: Reason for CRM: Patient called in regarding prescription  amphetamine-dextroamphetamine (ADDERALL XR) 25 MG 24 hr capsule is calling to see the status of it being sent over to her pharmacy. Patient is requesting a callback

## 2023-12-01 NOTE — Telephone Encounter (Signed)
 Prescription sent to pharmacy.

## 2023-12-01 NOTE — Telephone Encounter (Signed)
 Pt has been notified.

## 2023-12-01 NOTE — Addendum Note (Signed)
 Addended by: Sheliah Hatch on: 12/01/2023 11:56 AM   Modules accepted: Orders

## 2023-12-29 ENCOUNTER — Other Ambulatory Visit: Payer: Self-pay | Admitting: Family Medicine

## 2023-12-29 NOTE — Telephone Encounter (Signed)
 Last Fill: 12/01/23 30 tabs/0 RF  Last OV: 11/13/22 Next OV: 02/21/24  Routing to provider for review/authorization.

## 2023-12-29 NOTE — Telephone Encounter (Signed)
 Copied from CRM [id number]. Topic: Clinical - Medication Refill >> Dec 29, 2023  4:00 PM Cammy Copa D wrote: Most Recent Primary Care Visit:  Provider: Sheliah Hatch  Department: [facility]  Visit Type: OFFICE VISIT  Date: 11/13/2022  Medication: amphetamine-dextroamphetamine (ADDERALL XR) 25 MG 24 hr capsule  Has the patient contacted their pharmacy? Yes, said that she needed to reach out to provider.  (Agent: If no, request that the patient contact the pharmacy for the refill. If patient does not wish to contact the pharmacy document the reason why and proceed with request.) (Agent: If yes, when and what did the pharmacy advise?)  Is this the correct pharmacy for this prescription? Yes If no, delete pharmacy and type the correct one.  This is the patient's preferred pharmacy:  [id number] [place], [place] - [address] [place] [address] Phone: [phone] Fax: [phone]   Has the prescription been filled recently? Yes  Is the patient out of the medication? No  Has the patient been seen for an appointment in the last year OR does the patient have an upcoming appointment? Yes  Can we respond through MyChart? Yes  Agent: Please be advised that Rx refills may take up to 3 business days. We ask that you follow-up with your pharmacy.

## 2023-12-30 MED ORDER — AMPHETAMINE-DEXTROAMPHET ER 25 MG PO CP24: 25.0000 mg | ORAL_CAPSULE | ORAL | 0 refills | Status: DC

## 2024-01-28 ENCOUNTER — Other Ambulatory Visit: Payer: Self-pay

## 2024-01-28 ENCOUNTER — Other Ambulatory Visit: Payer: Self-pay | Admitting: Family Medicine

## 2024-01-28 NOTE — Telephone Encounter (Signed)
 Copied from CRM [id number]. Topic: Clinical - Medication Refill >> Jan 28, 2024  4:45 PM Shereese L wrote: Medication:amphetamine -dextroamphetamine  (ADDERALL XR) 25 MG 24 hr capsule    Has the patient contacted their pharmacy? Yes (Agent: If no, request that the patient contact the pharmacy for the refill. If patient does not wish to contact the pharmacy document the reason why and proceed with request.) (Agent: If yes, when and what did the pharmacy advise?)  This is the patient's preferred pharmacy:  [id number] [place], [place] - [address] [place] [address] Phone: [phone] Fax: [phone]  Is this the correct pharmacy for this prescription? Yes If no, delete pharmacy and type the correct one.   Has the prescription been filled recently? No  Is the patient out of the medication? Yes  Has the patient been seen for an appointment in the last year OR does the patient have an upcoming appointment? Yes  Can we respond through MyChart? Yes  Agent: Please be advised that Rx refills may take up to 3 business days. We ask that you follow-up with your pharmacy.

## 2024-01-29 ENCOUNTER — Other Ambulatory Visit: Payer: Self-pay | Admitting: Family Medicine

## 2024-01-29 MED ORDER — AMPHETAMINE-DEXTROAMPHET ER 25 MG PO CP24: 25.0000 mg | ORAL_CAPSULE | ORAL | 0 refills | Status: DC

## 2024-01-29 NOTE — Telephone Encounter (Signed)
 Requested Prescriptions   Pending Prescriptions Disp Refills   amphetamine -dextroamphetamine  (ADDERALL XR) 25 MG 24 hr capsule 30 capsule 0    Sig: Take 1 capsule by mouth every morning.     Date of patient request: 01/29/2024 Last office visit: Visit date not found Upcoming visit: Visit date not found Date of last refill: 12/30/2023 Last refill amount: 30

## 2024-02-29 ENCOUNTER — Other Ambulatory Visit: Payer: Self-pay | Admitting: Family Medicine

## 2024-02-29 MED ORDER — AMPHETAMINE-DEXTROAMPHET ER 25 MG PO CP24: 25.0000 mg | ORAL_CAPSULE | ORAL | 0 refills | Status: DC

## 2024-02-29 NOTE — Telephone Encounter (Signed)
 Requested Prescriptions   Pending Prescriptions Disp Refills   amphetamine -dextroamphetamine  (ADDERALL XR) 25 MG 24 hr capsule 30 capsule 0    Sig: Take 1 capsule by mouth every morning.     Date of patient request: 02/29/2024 Last office visit: Visit date not found Upcoming visit: 05/04/2024 Date of last refill: 01/29/2024 Last refill amount: 30

## 2024-02-29 NOTE — Telephone Encounter (Signed)
 Copied from CRM [id number]. Topic: Clinical - Medication Refill >> Feb 29, 2024 10:52 AM Trula Gable C wrote: Medication: amphetamine -dextroamphetamine  (ADDERALL XR) 25 MG 24 hr capsule  Has the patient contacted their pharmacy? Yes (Agent: If no, request that the patient contact the pharmacy for the refill. If patient does not wish to contact the pharmacy document the reason why and proceed with request.) (Agent: If yes, when and what did the pharmacy advise?)  This is the patient's preferred pharmacy:  [id number] [place], [place] - [address] [place]  [address] Phone: [phone] Fax: [phone]  Is this the correct pharmacy for this prescription? Yes If no, delete pharmacy and type the correct one.   Has the prescription been filled recently? No  Is the patient out of the medication? Yes  Has the patient been seen for an appointment in the last year OR does the patient have an upcoming appointment? Yes  Can we respond through MyChart? Yes  Agent: Please be advised that Rx refills may take up to 3 business days. We ask that you follow-up with your pharmacy.

## 2024-03-03 ENCOUNTER — Encounter: Payer: Self-pay | Admitting: Family Medicine

## 2024-03-30 ENCOUNTER — Other Ambulatory Visit: Payer: Self-pay | Admitting: Family Medicine

## 2024-03-30 MED ORDER — AMPHETAMINE-DEXTROAMPHET ER 25 MG PO CP24: 25.0000 mg | ORAL_CAPSULE | ORAL | 0 refills | Status: DC

## 2024-03-30 NOTE — Telephone Encounter (Signed)
 Requested Prescriptions   Pending Prescriptions Disp Refills   amphetamine -dextroamphetamine  (ADDERALL XR) 25 MG 24 hr capsule 30 capsule 0    Sig: Take 1 capsule by mouth every morning.     Date of patient request: 03/30/2024 Last office visit: Visit date not found Upcoming visit: 05/04/2024 Date of last refill: 02/29/2024 Last refill amount: 30

## 2024-03-30 NOTE — Telephone Encounter (Signed)
 Copied from CRM [id number]. Topic: Clinical - Medication Refill >> Mar 30, 2024 10:05 AM Rea C wrote: Medication: amphetamine -dextroamphetamine  (ADDERALL XR) 25 MG 24 hr capsule  Has the patient contacted their pharmacy? Yes (Agent: If no, request that the patient contact the pharmacy for the refill. If patient does not wish to contact the pharmacy document the reason why and proceed with request.) (Agent: If yes, when and what did the pharmacy advise?)  This is the patient's preferred pharmacy:  [id number] [place], [place] - [address] [place] [address] Phone: [phone] Fax: [phone]  Is this the correct pharmacy for this prescription? Yes If no, delete pharmacy and type the correct one.   Has the prescription been filled recently? Yes  Is the patient out of the medication? Have one left after today   Has the patient been seen for an appointment in the last year OR does the patient have an upcoming appointment? Yes  Can we respond through MyChart? Yes  Agent: Please be advised that Rx refills may take up to 3 business days. We ask that you follow-up with your pharmacy.

## 2024-04-27 ENCOUNTER — Other Ambulatory Visit: Payer: Self-pay | Admitting: Family Medicine

## 2024-04-27 MED ORDER — AMPHETAMINE-DEXTROAMPHET ER 25 MG PO CP24: 25.0000 mg | ORAL_CAPSULE | ORAL | 0 refills | Status: DC

## 2024-04-27 NOTE — Telephone Encounter (Signed)
 Requested Prescriptions   Pending Prescriptions Disp Refills   amphetamine -dextroamphetamine  (ADDERALL XR) 25 MG 24 hr capsule 30 capsule 0    Sig: Take 1 capsule by mouth every morning.     Date of patient request: 04/27/24 Last office visit: Visit date not found Upcoming visit: 07/06/2024 Date of last refill: 03/30/24 Last refill amount: 30 day

## 2024-04-27 NOTE — Telephone Encounter (Signed)
 Copied from CRM [id number]. Topic: Clinical - Medication Refill >> Apr 27, 2024  2:52 PM Chiquita SQUIBB wrote: Medication:  amphetamine -dextroamphetamine  amphetamine -dextroamphetamine  (ADDERALL XR) 25 MG 24 hr capsule   Has the patient contacted their pharmacy? Yes (Agent: If no, request that the patient contact the pharmacy for the refill. If patient does not wish to contact the pharmacy document the reason why and proceed with request.) (Agent: If yes, when and what did the pharmacy advise?)  This is the patient's preferred pharmacy:  [id number] [place], [place] - [address] [place] [address] Phone: [phone] Fax: [phone]  Is this the correct pharmacy for this prescription? Yes If no, delete pharmacy and type the correct one.   Has the prescription been filled recently? No  Is the patient out of the medication? No- Patient has two left  Has the patient been seen for an appointment in the last year OR does the patient have an upcoming appointment? Yes  Can we respond through MyChart? Yes  Agent: Please be advised that Rx refills may take up to 3 business days. We ask that you follow-up with your pharmacy.

## 2024-05-04 ENCOUNTER — Encounter: Payer: Self-pay | Admitting: Family Medicine

## 2024-05-27 ENCOUNTER — Other Ambulatory Visit: Payer: Self-pay | Admitting: Family Medicine

## 2024-05-27 MED ORDER — AMPHETAMINE-DEXTROAMPHET ER 25 MG PO CP24: 25.0000 mg | ORAL_CAPSULE | ORAL | 0 refills | Status: DC

## 2024-05-27 NOTE — Telephone Encounter (Signed)
 Requested Prescriptions   Pending Prescriptions Disp Refills   amphetamine -dextroamphetamine  (ADDERALL XR) 25 MG 24 hr capsule 30 capsule 0    Sig: Take 1 capsule by mouth every morning.     Date of patient request: 05/27/2024 Last office visit: Visit date not found Upcoming visit: 07/06/2024 Date of last refill: 04/27/2024 Last refill amount: 30

## 2024-05-27 NOTE — Telephone Encounter (Signed)
 Copied from CRM [id number]. Topic: Clinical - Medication Refill >> May 27, 2024 10:50 AM Sophia H wrote: Medication: amphetamine -dextroamphetamine  (ADDERALL XR) 25 MG 24 hr capsule    Has the patient contacted their pharmacy? Yes, told contact office.   This is the patient's preferred pharmacy:  [id number] [place], [place] - [address] [place] [address] Phone: [phone] Fax: [phone]  Is this the correct pharmacy for this prescription? Yes If no, delete pharmacy and type the correct one.   Has the prescription been filled recently? Yes  Is the patient out of the medication? Yes  Has the patient been seen for an appointment in the last year OR does the patient have an upcoming appointment? Yes, has an appt scheduled 10/15.   Can we respond through MyChart? Yes but prefers phone call if you need to reach her. Will be in meetings today.   Agent: Please be advised that Rx refills may take up to 3 business days. We ask that you follow-up with your pharmacy.

## 2024-07-01 ENCOUNTER — Other Ambulatory Visit: Payer: Self-pay | Admitting: Family Medicine

## 2024-07-01 MED ORDER — AMPHETAMINE-DEXTROAMPHET ER 25 MG PO CP24: 25.0000 mg | ORAL_CAPSULE | ORAL | 0 refills | Status: DC

## 2024-07-01 NOTE — Telephone Encounter (Signed)
 Requested Prescriptions   Pending Prescriptions Disp Refills   amphetamine -dextroamphetamine  (ADDERALL XR) 25 MG 24 hr capsule 30 capsule 0    Sig: Take 1 capsule by mouth every morning.     Date of patient request: 07/01/24 Last office visit: Visit date not found Upcoming visit: 09/08/2024 Date of last refill: 05/27/24 Last refill amount: 30 capsules

## 2024-07-01 NOTE — Telephone Encounter (Signed)
 Copied from CRM [id number]. Topic: Clinical - Medication Refill >> Jul 01, 2024 10:25 AM Dedra B wrote: Medication: amphetamine -dextroamphetamine  (ADDERALL XR) 25 MG 24 hr capsule  Has the patient contacted their pharmacy? No, calls office   This is the patient's preferred pharmacy:  [id number] [place], [place] - [address] [place] [address] Phone: [phone] Fax: [phone]  Is this the correct pharmacy for this prescription? Yes  Has the prescription been filled recently? No  Is the patient out of the medication? No, 1 left  Has the patient been seen for an appointment in the last year OR does the patient have an upcoming appointment? Yes  Can we respond through MyChart? Yes  Agent: Please be advised that Rx refills may take up to 3 business days. We ask that you follow-up with your pharmacy.

## 2024-07-06 ENCOUNTER — Encounter: Payer: Self-pay | Admitting: Family Medicine

## 2024-08-01 ENCOUNTER — Other Ambulatory Visit: Payer: Self-pay | Admitting: Family Medicine

## 2024-08-01 NOTE — Telephone Encounter (Signed)
 Copied from CRM [id number]. Topic: Clinical - Medication Refill >> Aug 01, 2024  1:25 PM Taleah C wrote: Medication: adderall   Has the patient contacted their pharmacy? Yes Controlled substance  This is the patient's preferred pharmacy:  [id number] [place], [place] - [address] [place] [address] Phone: [phone] Fax: [phone]  Is this the correct pharmacy for this prescription? Yes If no, delete pharmacy and type the correct one.   Has the prescription been filled recently? Yes  Is the patient out of the medication? No  Has the patient been seen for an appointment in the last year OR does the patient have an upcoming appointment? Yes  Can we respond through MyChart? No  Agent: Please be advised that Rx refills may take up to 3 business days. We ask that you follow-up with your pharmacy.

## 2024-08-03 ENCOUNTER — Other Ambulatory Visit: Payer: Self-pay | Admitting: Family Medicine

## 2024-08-03 MED ORDER — AMPHETAMINE-DEXTROAMPHET ER 25 MG PO CP24: 25.0000 mg | ORAL_CAPSULE | ORAL | 0 refills | Status: DC

## 2024-08-03 NOTE — Telephone Encounter (Signed)
 Copied from CRM [id number]. Topic: Clinical - Medication Refill >> Aug 01, 2024  1:25 PM Taleah C wrote: Medication: adderall   Has the patient contacted their pharmacy? Yes Controlled substance  This is the patient's preferred pharmacy:  [id number] [place], [place] - [address] [place] [address] Phone: [phone] Fax: [phone]  Is this the correct pharmacy for this prescription? Yes If no, delete pharmacy and type the correct one.   Has the prescription been filled recently? Yes  Is the patient out of the medication? No  Has the patient been seen for an appointment in the last year OR does the patient have an upcoming appointment? Yes  Can we respond through MyChart? No  Agent: Please be advised that Rx refills may take up to 3 business days. We ask that you follow-up with your pharmacy. >> Aug 03, 2024  1:23 PM Drema MATSU wrote: Patient is calling to check on refill. She is completely out. Advised of 3 business day timeframe

## 2024-08-03 NOTE — Telephone Encounter (Signed)
 Requested Prescriptions   Pending Prescriptions Disp Refills   amphetamine -dextroamphetamine  (ADDERALL XR) 25 MG 24 hr capsule 30 capsule 0    Sig: Take 1 capsule by mouth every morning.     Date of patient request: 08/03/2024 Last office visit: Visit date not found Upcoming visit: 09/08/2024 Date of last refill: 07/01/2024 Last refill amount: 30

## 2024-08-30 ENCOUNTER — Other Ambulatory Visit: Payer: Self-pay | Admitting: Family Medicine

## 2024-08-30 MED ORDER — AMPHETAMINE-DEXTROAMPHET ER 25 MG PO CP24: 25.0000 mg | ORAL_CAPSULE | ORAL | 0 refills | Status: DC

## 2024-08-30 NOTE — Telephone Encounter (Signed)
 Requested Prescriptions   Pending Prescriptions Disp Refills   amphetamine -dextroamphetamine  (ADDERALL XR) 25 MG 24 hr capsule 30 capsule 0    Sig: Take 1 capsule by mouth every morning.     Date of patient request: 08/30/24 Last office visit: Visit date not found Upcoming visit: 09/08/2024 Date of last refill: 08/03/24 Last refill amount: 30

## 2024-08-30 NOTE — Telephone Encounter (Signed)
 Copied from CRM [id number]. Topic: Clinical - Medication Refill >> Aug 30, 2024  2:28 PM Suzen RAMAN wrote: Medication: amphetamine -dextroamphetamine  (ADDERALL XR) 25 MG 24 hr capsule   Has the patient contacted their pharmacy? Yes   This is the patient's preferred pharmacy:  [id number] [place], [place] - [address] [place] [address] Phone: [phone] Fax: [phone]  Is this the correct pharmacy for this prescription? Yes If no, delete pharmacy and type the correct one.   Has the prescription been filled recently? Yes  Is the patient out of the medication? No  Has the patient been seen for an appointment in the last year OR does the patient have an upcoming appointment? Yes  Can we respond through MyChart? Yes  Agent: Please be advised that Rx refills may take up to 3 business days. We ask that you follow-up with your pharmacy.

## 2024-09-02 ENCOUNTER — Telehealth: Payer: Self-pay

## 2024-09-02 NOTE — Telephone Encounter (Signed)
 noted

## 2024-09-02 NOTE — Telephone Encounter (Signed)
 Copied from CRM #[id number]. Topic: Clinical - Prescription Issue >> Sep 02, 2024 11:32 AM Berneda FALCON wrote: Reason for CRM: Patient states that the pharmacy does not have the order for the amphetamine -dextroamphetamine  (ADDERALL XR) 25 MG 24 hr capsule which I do see was sent over on 12/9 by PCP. I did verify it was sent to the correct pharmacy. Patient states she is going to call them back and make sure they dont have it.

## 2024-09-08 ENCOUNTER — Encounter: Payer: Self-pay | Admitting: Family Medicine

## 2024-09-24 ENCOUNTER — Other Ambulatory Visit: Payer: Self-pay | Admitting: Family Medicine

## 2024-10-04 ENCOUNTER — Other Ambulatory Visit: Payer: Self-pay | Admitting: Family Medicine

## 2024-10-04 NOTE — Telephone Encounter (Signed)
 Requested Prescriptions   Pending Prescriptions Disp Refills   amphetamine -dextroamphetamine  (ADDERALL XR) 25 MG 24 hr capsule 30 capsule 0    Sig: Take 1 capsule by mouth every morning.     Date of patient request: 10/04/2024 Last office visit: 10/2022 Upcoming visit: 11/23/2024 Date of last refill: 10/01/2023 Last refill amount: 30

## 2024-10-04 NOTE — Telephone Encounter (Unsigned)
 Copied from CRM [id number]. Topic: Clinical - Medication Refill >> Oct 04, 2024  1:56 PM Kristi Hill wrote: Medication: amphetamine -dextroamphetamine  (ADDERALL XR) 25 MG 24 hr capsule [[id number]]  Has the patient contacted their pharmacy? Yes (Agent: If no, request that the patient contact the pharmacy for the refill. If patient does not wish to contact the pharmacy document the reason why and proceed with request.) (Agent: If yes, when and what did the pharmacy advise?)  This is the patient's preferred pharmacy:  [id number] [address], [place] - [address] [place] [address] Phone: [phone] Fax: [phone]  Is this the correct pharmacy for this prescription? Yes If no, delete pharmacy and type the correct one.   Has the prescription been filled recently? Yes  Is the patient out of the medication? Yes  Has the patient been seen for an appointment in the last year OR does the patient have an upcoming appointment? Yes  Can we respond through MyChart? Yes  Agent: Please be advised that Rx refills may take up to 3 business days. We ask that you follow-up with your pharmacy.

## 2024-10-05 MED ORDER — AMPHETAMINE-DEXTROAMPHET ER 25 MG PO CP24: 25.0000 mg | ORAL_CAPSULE | ORAL | 0 refills | Status: AC

## 2024-10-21 ENCOUNTER — Other Ambulatory Visit: Payer: Self-pay | Admitting: Family Medicine

## 2024-11-23 ENCOUNTER — Encounter: Payer: Self-pay | Admitting: Family Medicine
